# Patient Record
Sex: Female | Born: 1945 | Race: White | Hispanic: No | Smoking: Never smoker
Health system: Southern US, Community
[De-identification: ages and names within clinical notes are randomized; demographics above are authoritative.]

## PROBLEM LIST (undated history)

## (undated) DIAGNOSIS — F329 Major depressive disorder, single episode, unspecified: Secondary | ICD-10-CM

## (undated) DIAGNOSIS — F419 Anxiety disorder, unspecified: Secondary | ICD-10-CM

## (undated) DIAGNOSIS — K219 Gastro-esophageal reflux disease without esophagitis: Secondary | ICD-10-CM

## (undated) DIAGNOSIS — B019 Varicella without complication: Secondary | ICD-10-CM

## (undated) DIAGNOSIS — I10 Essential (primary) hypertension: Secondary | ICD-10-CM

## (undated) DIAGNOSIS — M1990 Unspecified osteoarthritis, unspecified site: Secondary | ICD-10-CM

## (undated) DIAGNOSIS — R51 Headache: Secondary | ICD-10-CM

## (undated) DIAGNOSIS — Z860100 Personal history of colon polyps, unspecified: Secondary | ICD-10-CM

## (undated) DIAGNOSIS — E785 Hyperlipidemia, unspecified: Secondary | ICD-10-CM

## (undated) DIAGNOSIS — K589 Irritable bowel syndrome without diarrhea: Secondary | ICD-10-CM

## (undated) DIAGNOSIS — T7840XA Allergy, unspecified, initial encounter: Secondary | ICD-10-CM

## (undated) DIAGNOSIS — F32A Depression, unspecified: Secondary | ICD-10-CM

## (undated) HISTORY — DX: Irritable bowel syndrome, unspecified: K58.9

## (undated) HISTORY — PX: ABDOMINAL HYSTERECTOMY: SHX81

## (undated) HISTORY — DX: Depression, unspecified: F32.A

## (undated) HISTORY — DX: Unspecified osteoarthritis, unspecified site: M19.90

## (undated) HISTORY — DX: Anxiety disorder, unspecified: F41.9

## (undated) HISTORY — PX: TUBAL LIGATION: SHX77

## (undated) HISTORY — DX: Varicella without complication: B01.9

## (undated) HISTORY — PX: EXCISIONAL HEMORRHOIDECTOMY: SHX1541

## (undated) HISTORY — DX: Hyperlipidemia, unspecified: E78.5

## (undated) HISTORY — PX: CHOLECYSTECTOMY: SHX55

## (undated) HISTORY — DX: Major depressive disorder, single episode, unspecified: F32.9

## (undated) HISTORY — DX: Personal history of colon polyps, unspecified: Z86.0100

## (undated) HISTORY — DX: Gastro-esophageal reflux disease without esophagitis: K21.9

## (undated) HISTORY — PX: OTHER SURGICAL HISTORY: SHX169

## (undated) HISTORY — DX: Allergy, unspecified, initial encounter: T78.40XA

## (undated) HISTORY — DX: Personal history of colonic polyps: Z86.010

---
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
## 1973-04-28 HISTORY — PX: OTHER SURGICAL HISTORY: SHX169

## 1973-04-28 HISTORY — PX: ABDOMINAL HYSTERECTOMY: SHX81

## 1975-04-29 HISTORY — PX: CHOLECYSTECTOMY: SHX55

## 1997-04-28 HISTORY — PX: BREAST BIOPSY: SHX20

## 1997-09-26 ENCOUNTER — Encounter: Admission: RE | Admit: 1997-09-26 | Discharge: 1997-12-25 | Payer: Self-pay | Admitting: *Deleted

## 2001-04-28 HISTORY — PX: EXCISIONAL HEMORRHOIDECTOMY: SHX1541

## 2002-04-28 LAB — HM COLONOSCOPY

## 2010-04-28 LAB — HM MAMMOGRAPHY

## 2011-07-17 ENCOUNTER — Other Ambulatory Visit: Payer: Self-pay

## 2011-07-17 ENCOUNTER — Observation Stay (HOSPITAL_COMMUNITY)
Admission: EM | Admit: 2011-07-17 | Discharge: 2011-07-19 | DRG: 312 | Disposition: A | Discharge status: Home / Self Care | Payer: Medicare Other | Attending: Family Medicine | Admitting: Family Medicine

## 2011-07-17 ENCOUNTER — Emergency Department (HOSPITAL_COMMUNITY): Payer: Medicare Other

## 2011-07-17 ENCOUNTER — Encounter (HOSPITAL_COMMUNITY): Payer: Self-pay | Admitting: Emergency Medicine

## 2011-07-17 DIAGNOSIS — E785 Hyperlipidemia, unspecified: Secondary | ICD-10-CM | POA: Insufficient documentation

## 2011-07-17 DIAGNOSIS — R05 Cough: Secondary | ICD-10-CM | POA: Insufficient documentation

## 2011-07-17 DIAGNOSIS — R5381 Other malaise: Secondary | ICD-10-CM | POA: Insufficient documentation

## 2011-07-17 DIAGNOSIS — I129 Hypertensive chronic kidney disease with stage 1 through stage 4 chronic kidney disease, or unspecified chronic kidney disease: Secondary | ICD-10-CM | POA: Insufficient documentation

## 2011-07-17 DIAGNOSIS — I440 Atrioventricular block, first degree: Secondary | ICD-10-CM | POA: Diagnosis present

## 2011-07-17 DIAGNOSIS — I959 Hypotension, unspecified: Secondary | ICD-10-CM | POA: Diagnosis present

## 2011-07-17 DIAGNOSIS — I10 Essential (primary) hypertension: Secondary | ICD-10-CM | POA: Diagnosis present

## 2011-07-17 DIAGNOSIS — N189 Chronic kidney disease, unspecified: Secondary | ICD-10-CM | POA: Diagnosis present

## 2011-07-17 DIAGNOSIS — I498 Other specified cardiac arrhythmias: Secondary | ICD-10-CM | POA: Insufficient documentation

## 2011-07-17 DIAGNOSIS — R55 Syncope and collapse: Secondary | ICD-10-CM | POA: Diagnosis present

## 2011-07-17 DIAGNOSIS — N179 Acute kidney failure, unspecified: Secondary | ICD-10-CM

## 2011-07-17 DIAGNOSIS — R197 Diarrhea, unspecified: Secondary | ICD-10-CM | POA: Insufficient documentation

## 2011-07-17 DIAGNOSIS — R059 Cough, unspecified: Secondary | ICD-10-CM | POA: Insufficient documentation

## 2011-07-17 DIAGNOSIS — R531 Weakness: Secondary | ICD-10-CM

## 2011-07-17 DIAGNOSIS — E119 Type 2 diabetes mellitus without complications: Secondary | ICD-10-CM | POA: Insufficient documentation

## 2011-07-17 DIAGNOSIS — D72829 Elevated white blood cell count, unspecified: Secondary | ICD-10-CM | POA: Diagnosis present

## 2011-07-17 HISTORY — DX: Acute kidney failure, unspecified: N17.9

## 2011-07-17 HISTORY — DX: Essential (primary) hypertension: I10

## 2011-07-17 HISTORY — DX: Headache: R51

## 2011-07-17 LAB — GLUCOSE, CAPILLARY

## 2011-07-17 LAB — CBC
Hemoglobin: 11.5 g/dL — ABNORMAL LOW (ref 12.0–15.0)
MCH: 30.2 pg (ref 26.0–34.0)
MCHC: 33.7 g/dL (ref 30.0–36.0)
Platelets: 200 10*3/uL (ref 150–400)

## 2011-07-17 LAB — BASIC METABOLIC PANEL
Calcium: 8.7 mg/dL (ref 8.4–10.5)
Creatinine, Ser: 1.46 mg/dL — ABNORMAL HIGH (ref 0.50–1.10)
GFR calc Af Amer: 42 mL/min — ABNORMAL LOW (ref >90)
GFR calc non Af Amer: 37 mL/min — ABNORMAL LOW (ref >90)

## 2011-07-17 LAB — URINALYSIS, ROUTINE W REFLEX MICROSCOPIC
Bilirubin Urine: NEGATIVE
Ketones, ur: NEGATIVE mg/dL
Nitrite: NEGATIVE
Urobilinogen, UA: 1 mg/dL (ref 0.0–1.0)

## 2011-07-17 LAB — CARDIAC PANEL(CRET KIN+CKTOT+MB+TROPI)
CK, MB: 0.8 ng/mL (ref 0.3–4.0)
Relative Index: INVALID (ref 0.0–2.5)
Total CK: 40 U/L (ref 7–177)
Troponin I: <0.3 ng/mL (ref <0.30)

## 2011-07-17 LAB — LACTIC ACID, PLASMA: Lactic Acid, Venous: 0.7 mmol/L (ref 0.5–2.2)

## 2011-07-17 MED ORDER — CITALOPRAM HYDROBROMIDE 40 MG PO TABS
40.0000 mg | ORAL_TABLET | Freq: Every day | ORAL | Status: DC
  Administered 2011-07-18 – 2011-07-19 (×3): 40 mg via ORAL
  Filled 2011-07-17 (×3): qty 1

## 2011-07-17 MED ORDER — INSULIN DETEMIR 100 UNIT/ML ~~LOC~~ SOLN
50.0000 [IU] | Freq: Two times a day (BID) | SUBCUTANEOUS | Status: DC
  Administered 2011-07-18: 50 [IU] via SUBCUTANEOUS
  Filled 2011-07-17: qty 10

## 2011-07-17 MED ORDER — PANTOPRAZOLE SODIUM 20 MG PO TBEC
20.0000 mg | DELAYED_RELEASE_TABLET | Freq: Every day | ORAL | Status: DC
  Administered 2011-07-18 – 2011-07-19 (×3): 20 mg via ORAL
  Filled 2011-07-17 (×3): qty 1

## 2011-07-17 MED ORDER — SODIUM CHLORIDE 0.9 % IJ SOLN: 3.0000 mL | Freq: Two times a day (BID) | INTRAMUSCULAR | Status: DC

## 2011-07-17 MED ORDER — LIRAGLUTIDE 18 MG/3ML ~~LOC~~ SOLN: 1.8000 mg | Freq: Every day | SUBCUTANEOUS | Status: DC

## 2011-07-17 MED ORDER — AMITRIPTYLINE HCL 50 MG PO TABS
50.0000 mg | ORAL_TABLET | Freq: Every day | ORAL | Status: DC
  Administered 2011-07-18: 50 mg via ORAL
  Filled 2011-07-17 (×2): qty 1

## 2011-07-17 MED ORDER — SODIUM CHLORIDE 0.9 % IV BOLUS (SEPSIS)
1000.0000 mL | Freq: Once | INTRAVENOUS | Status: AC
  Administered 2011-07-17: 1000 mL via INTRAVENOUS

## 2011-07-17 MED ORDER — HYDROCODONE-ACETAMINOPHEN 5-325 MG PO TABS: 1.0000 | ORAL_TABLET | ORAL | Status: DC | PRN

## 2011-07-17 MED ORDER — ONDANSETRON HCL 4 MG/2ML IJ SOLN: 4.0000 mg | Freq: Four times a day (QID) | INTRAMUSCULAR | Status: DC | PRN

## 2011-07-17 MED ORDER — SIMVASTATIN 5 MG PO TABS
5.0000 mg | ORAL_TABLET | Freq: Every day | ORAL | Status: DC
  Administered 2011-07-18: 5 mg via ORAL
  Filled 2011-07-17 (×2): qty 1

## 2011-07-17 MED ORDER — SODIUM CHLORIDE 0.9 % IV SOLN
INTRAVENOUS | Status: DC
  Administered 2011-07-17 – 2011-07-18 (×3): via INTRAVENOUS

## 2011-07-17 MED ORDER — ONDANSETRON HCL 4 MG PO TABS: 4.0000 mg | ORAL_TABLET | Freq: Four times a day (QID) | ORAL | Status: DC | PRN

## 2011-07-17 MED ORDER — INSULIN ASPART 100 UNIT/ML ~~LOC~~ SOLN
0.0000 [IU] | Freq: Three times a day (TID) | SUBCUTANEOUS | Status: DC
  Administered 2011-07-18 (×2): 2 [IU] via SUBCUTANEOUS
  Administered 2011-07-19: 3 [IU] via SUBCUTANEOUS
  Filled 2011-07-17: qty 1

## 2011-07-17 MED ORDER — INSULIN ASPART 100 UNIT/ML ~~LOC~~ SOLN
0.0000 [IU] | Freq: Every day | SUBCUTANEOUS | Status: DC
  Administered 2011-07-18: via SUBCUTANEOUS
  Administered 2011-07-18: 2 [IU] via SUBCUTANEOUS
  Filled 2011-07-17: qty 1

## 2011-07-17 MED ORDER — TRAMADOL HCL 50 MG PO TABS: 50.0000 mg | ORAL_TABLET | Freq: Four times a day (QID) | ORAL | Status: DC | PRN

## 2011-07-17 MED ORDER — FERROUS SULFATE 325 (65 FE) MG PO TABS
325.0000 mg | ORAL_TABLET | Freq: Every day | ORAL | Status: DC
  Administered 2011-07-18 – 2011-07-19 (×2): 325 mg via ORAL
  Filled 2011-07-17 (×2): qty 1

## 2011-07-17 NOTE — ED Provider Notes (Signed)
History     CSN: [id number]  Arrival date & time 07/17/11  1333   First MD Initiated Contact with Patient 07/17/11 1510      Chief Complaint  Patient presents with  . Hypotension  . Bradycardia    (Consider location/radiation/quality/duration/timing/severity/associated sxs/prior treatment) HPI Patient presents with generalized weakness. She was out the ED with her mother today and experience profound weakness with a sensation that she did have a bowel movement. She went to the restroom and had an episode of near-syncope. Her mother found her in the bathroom and she was unable to stand due to weakness and was responding but very weak. She has no recent illnesses she has been eating and drinking normally. She denied any chest pain or palpitations. She's had no recent fevers. She does note that she felt somewhat weak upon awakening this morning but had felt well the past several days. Per EMS her blood pressure was 70/30 upon arrival and she was bradycardic at 51. She's had no recent medication changes except starting on Victoza 2 weeks ago, but had no adverse symptoms until today.  Pt denies rectal bleeding or melena  Past Medical History  Diagnosis Date  . Diabetes mellitus   . Hypertension   . Headache     migraines    Past Surgical History  Procedure Date  . Abdominal hysterectomy   . Cholecystectomy   . Tubal ligation     History reviewed. No pertinent family history.  History  Substance Use Topics  . Smoking status: Never Smoker   . Smokeless tobacco: Never Used  . Alcohol Use: No    OB History    Grav Para Term Preterm Abortions TAB SAB Ect Mult Living                  Review of Systems ROS reviewed and otherwise negative except for mentioned in HPI  Allergies  Codeine and Morphine and related  Home Medications   Current Outpatient Rx  Name Route Sig Dispense Refill  . AMITRIPTYLINE HCL 50 MG PO TABS Oral Take 50 mg by mouth at bedtime. sleep    .  CITALOPRAM HYDROBROMIDE 40 MG PO TABS Oral Take 40 mg by mouth daily.    . IRON 325 (65 FE) MG PO TABS Oral Take 1 tablet by mouth daily.    . FUROSEMIDE 20 MG PO TABS Oral Take 20 mg by mouth every other day.    Marland Kitchen HYDROCODONE-ACETAMINOPHEN 5-500 MG PO TABS Oral Take 1 tablet by mouth every 6 (six) hours as needed. pain    . INSULIN DETEMIR 100 UNIT/ML [place] SOLN Subcutaneous Inject 50 Units into the skin 2 (two) times daily.    Marland Kitchen LANSOPRAZOLE 30 MG PO CPDR Oral Take 30 mg by mouth daily.    Marland Kitchen VICTOZA [place] Subcutaneous Inject 1.8 mg into the skin daily. PT USES THE FLEX PEN    . LISINOPRIL 5 MG PO TABS Oral Take 5 mg by mouth daily.    . MULTI-VITAMIN/MINERALS PO TABS Oral Take 1 tablet by mouth daily.    Marland Kitchen FISH OIL 1000 MG PO CPDR Oral Take 1 capsule by mouth 2 (two) times daily.    Marland Kitchen PRAVASTATIN SODIUM 40 MG PO TABS Oral Take 40 mg by mouth 2 (two) times daily.    Marland Kitchen PROPRANOLOL HCL 80 MG PO TABS Oral Take 80 mg by mouth 2 (two) times daily.    . TRAMADOL HCL 50 MG PO TABS Oral Take 50 mg  by mouth every 6 (six) hours as needed. pain      BP 99/74  Pulse 80  Temp(Src) 98.3 F (36.8 C) (Oral)  Resp 18  Ht 5\' 2"  (1.575 m)  Wt 180 lb (81.647 kg)  BMI 32.92 kg/m2  SpO2 100% Vitals reviewed Physical Exam Physical Examination: General appearance - alert, tired appearing, and in no acute distress Mental status - alert, oriented to person, place, and time Eyes - pupils equal and reactive, extraocular eye movements intact, no scleral icterus Mouth - mucous membranes moist, pharynx normal without lesions Chest - clear to auscultation, no wheezes, rales or rhonchi, symmetric air entry Heart - normal rate, regular rhythm, normal S1, S2, no murmurs, rubs, clicks or gallops Abdomen - soft, nontender, nondistended, no masses or organomegaly, nabs Neurological - alert, oriented, normal speech, strength 5/5 in extremities x 4, sensation intact, cranial nerves 2-12 tested and intact Musculoskeletal - no  joint tenderness, deformity or swelling Extremities - peripheral pulses normal, no pedal edema, no clubbing or cyanosis Skin - normal coloration and turgor, no rashes, brisk cap refill  ED Course  Procedures (including critical care time)  Date: 07/17/2011  Rate: 62  Rhythm: sinus rhythm with first degree AV block  QRS Axis: normal  Intervals: PR prolonged  ST/T Wave abnormalities: nonspecific ST/T changes  Conduction Disutrbances:first-degree A-V block   Narrative Interpretation:   Old EKG Reviewed: none available  8:10 PM d/w [facility] for admission, pt to go to telemetry bed, Team 5   Labs Reviewed  BASIC METABOLIC PANEL - Abnormal; Notable for the following:    Glucose, Bld 129 (*)    Creatinine, Ser 1.46 (*)    GFR calc non Af Amer 37 (*)    GFR calc Af Amer 42 (*)    All other components within normal limits  CBC - Abnormal; Notable for the following:    WBC 17.0 (*)    RBC 3.81 (*)    Hemoglobin 11.5 (*)    HCT 34.1 (*)    All other components within normal limits  GLUCOSE, CAPILLARY - Abnormal; Notable for the following:    Glucose-Capillary 269 (*)    All other components within normal limits  LACTIC ACID, PLASMA  URINALYSIS, ROUTINE W REFLEX MICROSCOPIC  MAGNESIUM  PHOSPHORUS  CARDIAC PANEL(CRET KIN+CKTOT+MB+TROPI)  CBC  TSH  CARDIAC PANEL(CRET KIN+CKTOT+MB+TROPI)  HEMOGLOBIN A1C  BASIC METABOLIC PANEL  CBC  HEMOGLOBIN A1C  TSH  CARDIAC PANEL(CRET KIN+CKTOT+MB+TROPI)   Dg Chest 2 View  07/17/2011  *RADIOLOGY REPORT*  Clinical Data: Cough.  Hypotension and bradycardia.  CHEST - 2 VIEW  Comparison: None.  Findings: Trachea is midline.  Heart size normal.  Lungs are low in volume but clear.  No pleural fluid.  IMPRESSION: No acute findings.  Original Report Authenticated By: Reyes Ivan, M.D.     1. Syncope   2. Leukocytosis   3. Weakness       MDM  Patient presenting with generalized weakness and a syncopal event.  Pt with  hypotensive and bradycardia- blood pressure improving in ED after IV hydration,  However patient continuing to c/o generalized weakness.  Pt admitted to [facility] for further management        Ethelda Chick, MD 07/18/11 [phone]

## 2011-07-17 NOTE — ED Notes (Addendum)
EMS bring pt from [employer] pt was found in bathroom after being gone a while by mother sitting on toilet fully dressed lumped over pt did not loose consciousness but felt too weak to move. EMS reports pt was bradycardic 51 and hypotensive 70/43.  Pt states she began a new med Victoza 2 weeks ago and has been feeling weak since beginning.

## 2011-07-17 NOTE — H&P (Signed)
PCP:  No primary provider on file.   DOA:  07/17/2011  1:52 PM  Chief Complaint:  Weakness  HPI: Pt is 66 yo female with past medical history noted below who presents to [facility] ED with main concern of feeling generalized weakness that occurred this morning of admission while pt was in the bathroom. Pt denies similar episodes in the past and reports no known heart or neurologic problems. In addition, she reports being in her usual state of health, had no chest pain or shortness of breath, no abdominal or urinary concerns, no focal neurologic deficits, no headaches and no visual changes. She also denies having done any strenuous physical activity prior to the event. She has not fallen and has not lost consciousness. She denies fevers, chills, other systmeic symptoms. Patient does report having diarrhea started on the day of admission and has had 2 episodes prior to arrival to ED.  Pt was noted to be hypotensive upon EMS arrival with SBP in 70's.  Assessment/Plan  Principal Problem:   *Presyncope - unclear what the provoking event is, perhaps secondary to viral ilness - orthostatic vitals are negative - will admit to telemetry floor for further observation - obtain cardiac work up: cardiac enzymes, TSH, CMET, B12 - IV fluids - PT evaluation  Active Problems:  Diarrhea - possibly secondary to viral gastroenteritis - continue IV fluids for now - patient had 2 episodes of diarrhea since the admission to ED   ARF (acute renal failure) - likely pre-renal in etiology secondary to dehydration - start IVF - BMP in AM   Leukocytosis - unclear etiology but perhaps secondary to viral gastroenteritis/norovirus - urinalysis is negative and CXR with no active cardiopulmonary disease - repeat CBC in am - continue IV fluids   Hypotension - hold home BP medications including lisinopril and lasix - provide IVF - monitor vitals on telemetry unit   Diabetes - check A1C and continue home medication  regimen - sliding scale insulin - continue CBG monitoring   Hyperlipidemia - continue statin  DVT Prophylaxis - SCD's  Code Status - FULL  Education  - test results and diagnostic studies were discussed with patient - patient verbalized the understanding - questions were answered at the bedside and contact information was provided for additional questions or concerns     Allergies: Allergies  Allergen Reactions  . Codeine     Chest pain  . Morphine And Related     Chest pain     Prior to Admission medications   Medication Sig Start Date End Date Taking? Authorizing Provider  amitriptyline (ELAVIL) 50 MG tablet Take 50 mg by mouth at bedtime. sleep   Yes Historical Provider, MD  citalopram (CELEXA) 40 MG tablet Take 40 mg by mouth daily.   Yes Historical Provider, MD  Ferrous Sulfate (IRON) 325 (65 FE) MG TABS Take 1 tablet by mouth daily.   Yes Historical Provider, MD  furosemide (LASIX) 20 MG tablet Take 20 mg by mouth every other day.   Yes Historical Provider, MD  HYDROcodone-acetaminophen (VICODIN) 5-500 MG per tablet Take 1 tablet by mouth every 6 (six) hours as needed. pain   Yes Historical Provider, MD  insulin detemir (LEVEMIR) 100 UNIT/ML injection Inject 50 Units into the skin 2 (two) times daily.   Yes Historical Provider, MD  lansoprazole (PREVACID) 30 MG capsule Take 30 mg by mouth daily.   Yes Historical Provider, MD  Liraglutide (VICTOZA [place]) Inject 1.8 mg into the skin daily. PT USES  THE FLEX PEN   Yes Historical Provider, MD  lisinopril (PRINIVIL,ZESTRIL) 5 MG tablet Take 5 mg by mouth daily.   Yes Historical Provider, MD  Multiple Vitamins-Minerals (MULTIVITAMIN WITH MINERALS) tablet Take 1 tablet by mouth daily.   Yes Historical Provider, MD  Omega-3 Fatty Acids (FISH OIL) 1000 MG CPDR Take 1 capsule by mouth 2 (two) times daily.   Yes Historical Provider, MD  pravastatin (PRAVACHOL) 40 MG tablet Take 40 mg by mouth 2 (two) times daily.   Yes Historical  Provider, MD  propranolol (INDERAL) 80 MG tablet Take 80 mg by mouth 2 (two) times daily.   Yes Historical Provider, MD  traMADol (ULTRAM) 50 MG tablet Take 50 mg by mouth every 6 (six) hours as needed. pain   Yes Historical Provider, MD    Past Medical History  Diagnosis Date  . Diabetes mellitus   . Hypertension   . Headache     migraines    Past Surgical History  Procedure Date  . Abdominal hysterectomy   . Cholecystectomy   . Tubal ligation     Social History:  reports that she has never smoked. She has never used smokeless tobacco. She reports that she does not drink alcohol or use illicit drugs.  History reviewed. No pertinent family history.  Review of Systems:  Constitutional: Denies fever, chills, diaphoresis, appetite change and fatigue.  HEENT: Denies photophobia, eye pain, redness, hearing loss, ear pain, congestion, sore throat, rhinorrhea, sneezing, mouth sores, trouble swallowing, neck pain, neck stiffness and tinnitus.   Respiratory: (-) SOB, DOE, (+) chronic cough, (-)chest tightness,  And no wheezing.   Cardiovascular: Denies chest pain, palpitations and leg swelling.  Gastrointestinal: Denies nausea, vomiting, abdominal pain, diarrhea, constipation, blood in stool and abdominal distention.  Genitourinary: Denies dysuria, urgency, frequency, hematuria, flank pain and difficulty urinating.  Musculoskeletal: Denies myalgias, back pain, joint swelling, arthralgias and gait problem.  Skin: Denies pallor, rash and wound.  Neurological: Denies dizziness, seizures, weakness, light-headedness, numbness and headaches.  Hematological: Denies adenopathy. Easy bruising, personal or family bleeding history  Psychiatric/Behavioral: Denies suicidal ideation, mood changes, confusion, nervousness, sleep disturbance and agitation   Physical Exam:  Filed Vitals:   07/17/11 1413 07/17/11 1446 07/17/11 1500 07/17/11 1530  BP: 94/48 110/43 108/50 122/85  Pulse: 66  66 63    Temp:      TempSrc:      Resp: 21 16 15 18   Height:      Weight:      SpO2: 100%  100% 99%    Constitutional: Vital signs reviewed.  Patient is in no acute distress and cooperative with exam. Alert and oriented x3.  Head: Normocephalic and atraumatic Ear: TM normal bilaterally Mouth: no erythema or exudates, MMM Eyes: PERRL, EOMI, conjunctivae normal, No scleral icterus.  Neck: Supple, Trachea midline normal ROM, No JVD, mass, thyromegaly, or carotid bruit present.  Cardiovascular: RRR, S1 normal, S2 normal, no MRG, pulses symmetric and intact bilaterally Pulmonary/Chest: CTAB, no wheezes, rales, or rhonchi Abdominal: Soft. Non-tender, non-distended, bowel sounds are normal, no masses, organomegaly, or guarding present.  GU: no CVA tenderness Musculoskeletal: No joint deformities, erythema, or stiffness, ROM full and no nontender Ext: no edema and no cyanosis, pulses palpable bilaterally (DP and PT) Hematology: no cervical, inginal, or axillary adenopathy.  Neurological: A&O x3, Strenght is normal and symmetric bilaterally, cranial nerve II-XII are grossly intact, no focal motor deficit, sensory intact to light touch bilaterally.  Skin: Warm, dry and intact. No rash,  cyanosis, or clubbing.  Psychiatric: Normal mood and affect. speech and behavior is normal. Judgment and thought content normal. Cognition and memory are normal.   Labs on Admission:  Results for orders placed during the hospital encounter of 07/17/11 (from the past 48 hour(s))  BASIC METABOLIC PANEL     Status: Abnormal   Collection Time   07/17/11  3:30 PM      Component Value Range Comment   Sodium 136  135 - 145 (mEq/L)    Potassium 4.8  3.5 - 5.1 (mEq/L) SLIGHT HEMOLYSIS   Chloride 108  96 - 112 (mEq/L)    CO2 21  19 - 32 (mEq/L)    Glucose, Bld 129 (*) 70 - 99 (mg/dL)    BUN 22  6 - 23 (mg/dL)    Creatinine, Ser 8.29 (*) 0.50 - 1.10 (mg/dL)    Calcium 8.7  8.4 - 10.5 (mg/dL)    GFR calc non Af Amer 37 (*)  >90 (mL/min)    GFR calc Af Amer 42 (*) >90 (mL/min)   LACTIC ACID, PLASMA     Status: Normal   Collection Time   07/17/11  3:30 PM      Component Value Range Comment   Lactic Acid, Venous 0.7  0.5 - 2.2 (mmol/L)   URINALYSIS, ROUTINE W REFLEX MICROSCOPIC     Status: Normal   Collection Time   07/17/11  4:04 PM      Component Value Range Comment   Color, Urine YELLOW  YELLOW     APPearance CLEAR  CLEAR     Specific Gravity, Urine 1.015  1.005 - 1.030     pH 6.0  5.0 - 8.0     Glucose, UA NEGATIVE  NEGATIVE (mg/dL)    Hgb urine dipstick NEGATIVE  NEGATIVE     Bilirubin Urine NEGATIVE  NEGATIVE     Ketones, ur NEGATIVE  NEGATIVE (mg/dL)    Protein, ur NEGATIVE  NEGATIVE (mg/dL)    Urobilinogen, UA 1.0  0.0 - 1.0 (mg/dL)    Nitrite NEGATIVE  NEGATIVE     Leukocytes, UA NEGATIVE  NEGATIVE  MICROSCOPIC NOT DONE ON URINES WITH NEGATIVE PROTEIN, BLOOD, LEUKOCYTES, NITRITE, OR GLUCOSE <1000 mg/dL.  CBC     Status: Abnormal   Collection Time   07/17/11  7:14 PM      Component Value Range Comment   WBC 17.0 (*) 4.0 - 10.5 (K/uL)    RBC 3.81 (*) 3.87 - 5.11 (MIL/uL)    Hemoglobin 11.5 (*) 12.0 - 15.0 (g/dL)    HCT 56.2 (*) 13.0 - 46.0 (%)    MCV 89.5  78.0 - 100.0 (fL)    MCH 30.2  26.0 - 34.0 (pg)    MCHC 33.7  30.0 - 36.0 (g/dL)    RDW 86.5  78.4 - 69.6 (%)    Platelets 200  150 - 400 (K/uL)     Time Spent on Admission: Over 30 minutes  Deadrick Stidd 07/17/2011, 8:20 PM  [facility] Pager # [phone] Main Office # [phone]

## 2011-07-17 NOTE — ED Notes (Signed)
[id number]<JY> Expected date:<BR> Expected time: 1:24 PM<BR> Means of arrival:<BR> Comments:<BR> M70 - 66yoF weakness x2wks

## 2011-07-18 ENCOUNTER — Other Ambulatory Visit: Payer: Self-pay

## 2011-07-18 LAB — BASIC METABOLIC PANEL
BUN: 23 mg/dL (ref 6–23)
Chloride: 109 mEq/L (ref 96–112)
Glucose, Bld: 105 mg/dL — ABNORMAL HIGH (ref 70–99)
Potassium: 3.9 mEq/L (ref 3.5–5.1)
Sodium: 138 mEq/L (ref 135–145)

## 2011-07-18 LAB — GLUCOSE, CAPILLARY
Glucose-Capillary: 67 mg/dL — ABNORMAL LOW (ref 70–99)
Glucose-Capillary: 126 mg/dL — ABNORMAL HIGH (ref 70–99)
Glucose-Capillary: 227 mg/dL — ABNORMAL HIGH (ref 70–99)

## 2011-07-18 LAB — CBC
HCT: 31.1 % — ABNORMAL LOW (ref 36.0–46.0)
Hemoglobin: 10.5 g/dL — ABNORMAL LOW (ref 12.0–15.0)
MCH: 30.1 pg (ref 26.0–34.0)
MCHC: 33.8 g/dL (ref 30.0–36.0)
RBC: 3.49 MIL/uL — ABNORMAL LOW (ref 3.87–5.11)

## 2011-07-18 LAB — CARDIAC PANEL(CRET KIN+CKTOT+MB+TROPI)
CK, MB: 1 ng/mL (ref 0.3–4.0)
Relative Index: INVALID (ref 0.0–2.5)
Total CK: 34 U/L (ref 7–177)
Troponin I: <0.3 ng/mL (ref <0.30)

## 2011-07-18 MED ORDER — INSULIN DETEMIR 100 UNIT/ML ~~LOC~~ SOLN
30.0000 [IU] | Freq: Two times a day (BID) | SUBCUTANEOUS | Status: DC
  Administered 2011-07-18 – 2011-07-19 (×2): 30 [IU] via SUBCUTANEOUS
  Filled 2011-07-18: qty 3

## 2011-07-18 MED ORDER — INSULIN DETEMIR 100 UNIT/ML ~~LOC~~ SOLN: 40.0000 [IU] | Freq: Two times a day (BID) | SUBCUTANEOUS | Status: DC

## 2011-07-18 NOTE — Evaluation (Signed)
Physical Therapy Evaluation One time eval and D/C from acute PT Patient Details Name: Mary Norris MRN: [id number] DOB: 1946/03/26 Today's Date: 07/18/2011  Problem List:  Patient Active Problem List  Diagnoses  . ARF (acute renal failure)  . Leukocytosis  . Hypotension  . Syncope  . Diabetes mellitus    Past Medical History:  Past Medical History  Diagnosis Date  . Diabetes mellitus   . Hypertension   . Headache     migraines   Past Surgical History:  Past Surgical History  Procedure Date  . Abdominal hysterectomy   . Cholecystectomy   . Tubal ligation     PT Assessment/Plan/Recommendation PT Assessment Clinical Impression Statement: Pt admitted for syncope episode.  Pt currently at modified independent level.  Pt educated to rest upon position changes and sit if feeling dizziness.  No dizziness upon evaluation.  Pt will have support from son and mother upon return home if needed.  No further f/u necessary. PT Recommendation/Assessment: Patent does not need any further PT services No Skilled PT: Patient is modified independent with all activity/mobility;Patient will have necessary level of assist by caregiver at discharge PT Recommendation Follow Up Recommendations: No PT follow up Equipment Recommended: None recommended by PT PT Goals     PT Evaluation Precautions/Restrictions  Precautions Required Braces or Orthoses: No Restrictions Weight Bearing Restrictions: No Other Position/Activity Restrictions: Pt w syncopal episode.  Recommended pt sit for a few minutes before standing w hen first getting up. Prior Functioning  Home Living Lives With: Alone Receives Help From: Family (MOther lives close but pt very I.) Type of Home: House Home Layout: One level Home Access: Stairs to enter;Ramped entrance Entrance Stairs-Rails: None Entrance Stairs-Number of Steps: 5 Bathroom Shower/Tub: Chief Operating Officer  Equipment: None Prior Function Level of Independence: Independent with basic ADLs;Independent with gait Able to Take Stairs?: Yes Driving: Yes Vocation: Retired Leisure: Hobbies-yes (Comment) Comments: Plays with dog. Cognition Cognition Arousal/Alertness: Awake/alert Overall Cognitive Status: Appears within functional limits for tasks assessed Cognition - Other Comments: Intact. Sensation/Coordination Sensation Light Touch: Appears Intact Coordination Gross Motor Movements are Fluid and Coordinated: Yes Fine Motor Movements are Fluid and Coordinated: Yes Extremity Assessment RUE Assessment RUE Assessment: Within Functional Limits LUE Assessment LUE Assessment: Within Functional Limits RLE Assessment RLE Assessment: Within Functional Limits LLE Assessment LLE Assessment: Within Functional Limits Mobility (including Balance) Bed Mobility Bed Mobility: Yes Supine to Sit: 7: Independent Transfers Transfers: Yes Sit to Stand: 7: Independent Stand to Sit: 7: Independent Ambulation/Gait Ambulation/Gait: Yes Ambulation/Gait Assistance: 6: Modified independent (Device/Increase time) Ambulation Distance (Feet): 400 Feet Assistive device: None Gait Pattern: Within Functional Limits Gait velocity: slow cadence    Exercise    End of Session PT - End of Session Activity Tolerance: Patient tolerated treatment well Patient left: in bed;with call bell in reach;with family/visitor present General Behavior During Session: [facility] for tasks performed Cognition: [facility] for tasks performed  Jalisa Sacco,KATHrine E 07/18/2011, 11:07 AM Pager: [phone]

## 2011-07-18 NOTE — Evaluation (Signed)
Occupational Therapy Evaluation Patient Details Name: Mary Norris MRN: [id number] DOB: 24-May-1945 Today's Date: 07/18/2011  Problem List:  Patient Active Problem List  Diagnoses  . ARF (acute renal failure)  . Leukocytosis  . Hypotension  . Syncope  . Diabetes mellitus    Past Medical History:  Past Medical History  Diagnosis Date  . Diabetes mellitus   . Hypertension   . Headache     migraines   Past Surgical History:  Past Surgical History  Procedure Date  . Abdominal hysterectomy   . Cholecystectomy   . Tubal ligation     OT Assessment/Plan/Recommendation OT Assessment Clinical Impression Statement: Pt admitted for near syncopal episode last night but now is functioning Ily with all adls.  Pts son will be around when she is d/c'd to check in on her as will pt's mother.  Pt with no further OT needs. OT Recommendation/Assessment: Patient does not need any further OT services OT Recommendation Follow Up Recommendations: No OT follow up Equipment Recommended: None recommended by OT OT Goals    OT Evaluation Precautions/Restrictions  Precautions Required Braces or Orthoses: No Restrictions Weight Bearing Restrictions: No Other Position/Activity Restrictions: Pt w syncopal episode.  Recommended pt sit for a few minutes before standing w hen first getting up. Prior Functioning Home Living Lives With: Alone Receives Help From: Family (MOther lives close but pt very I.) Type of Home: House Home Layout: One level Home Access: Stairs to enter;Ramped entrance Entrance Stairs-Rails: None Entrance Stairs-Number of Steps: 5 Bathroom Shower/Tub: Chief Operating Officer Equipment: None Prior Function Level of Independence: Independent with basic ADLs;Independent with gait Able to Take Stairs?: Yes Driving: Yes Vocation: Retired Leisure: Hobbies-yes (Comment) Comments: Plays with dog. ADL ADL Eating/Feeding:  Performed;Independent Where Assessed - Eating/Feeding: Edge of bed Grooming: Simulated;Independent Where Assessed - Grooming: Standing at sink Upper Body Bathing: Simulated;Independent Where Assessed - Upper Body Bathing: Sitting, chair Lower Body Bathing: Simulated;Independent Where Assessed - Lower Body Bathing: Sit to stand from chair Upper Body Dressing: Simulated;Independent Where Assessed - Upper Body Dressing: Sitting, chair Lower Body Dressing: Performed;Independent Where Assessed - Lower Body Dressing: Sit to stand from chair Toilet Transfer: Performed;Independent Toilet Transfer Method: Proofreader: Regular height toilet Toileting - Clothing Manipulation: Performed;Independent Where Assessed - Toileting Clothing Manipulation: Standing Toileting - Hygiene: Performed;Independent Where Assessed - Toileting Hygiene: Sit on 3-in-1 or toilet Tub/Shower Transfer: Not assessed Tub/Shower Transfer Method: Not assessed Ambulation Related to ADLs: Pt walked in room and in hallway without assist. ADL Comments: Pt overall very I with adls.  Pt w syncopal episode before arrival but seems to be functioning well now. Vision/Perception  Vision - History Baseline Vision: No visual deficits Patient Visual Report: No change from baseline Vision - Assessment Eye Alignment: Within Functional Limits Vision Assessment: Vision not tested Cognition Cognition Arousal/Alertness: Awake/alert Overall Cognitive Status: Appears within functional limits for tasks assessed Cognition - Other Comments: Intact. Sensation/Coordination Sensation Light Touch: Appears Intact Coordination Gross Motor Movements are Fluid and Coordinated: Yes Fine Motor Movements are Fluid and Coordinated: Yes Extremity Assessment RUE Assessment RUE Assessment: Within Functional Limits LUE Assessment LUE Assessment: Within Functional Limits Mobility  Bed Mobility Bed Mobility: Yes Supine to  Sit: 7: Independent Transfers Transfers: Yes Sit to Stand: 7: Independent Stand to Sit: 7: Independent Exercises   End of Session OT - End of Session Activity Tolerance: Patient tolerated treatment well Patient left: in bed;with call bell in reach;with family/visitor present Nurse Communication: Mobility  status for ambulation General Behavior During Session: [facility] for tasks performed Cognition: [facility] for tasks performed   Hope Budds [phone] 07/18/2011, 10:34 AM

## 2011-07-18 NOTE — Progress Notes (Signed)
CBG: 61 Treatment: 15 GM carbohydrate snack  Symptoms: None  Follow-up CBG: Time:0815 CBG Result:67  Possible Reasons for Event: Unknown  Comments/MD notified:will treat and recheck    Mary Norris, Yancey Flemings

## 2011-07-18 NOTE — Progress Notes (Signed)
[facility] Inpatient Progress Note  07/18/2011  Subjective: Pt reports no further episodes, she says that she has been told that she has had renal insufficiency in the past and has been taken off metformin.  Pt also says that she is taking levemir 50 units twice daily.  She denies having low BS at home but has had some low BS here in hospital.  Also she has recently started taking Victoza.    Objective:  Vital signs in last 24 hours: Filed Vitals:   07/18/11 0651 07/18/11 0654 07/18/11 0850 07/18/11 0855  BP: 165/75 158/66 141/69 141/69  Pulse: 88 89    Temp:      TempSrc:      Resp:      Height:      Weight:      SpO2:       Weight change:   Intake/Output Summary (Last 24 hours) at 07/18/11 1220 Last data filed at 07/18/11 1048  Gross per 24 hour  Intake    600 ml  Output   1050 ml  Net   -450 ml   Lab Results  Component Value Date   HGBA1C 7.5* 07/17/2011   Lab Results  Component Value Date   CREATININE 1.43* 07/18/2011    Review of Systems As above, otherwise all reviewed and reported negative  Physical Exam General - awake, alert, no distress, calm, cooperative HEENT - NCAT, MM dry Neck - supple, no JVD Lungs - BBS clear CV - normal s1, s2 sounds Abd - soft, normal BS, nondistended, no masses Ext - no cyanosis, no edema   Lab Results: Results for orders placed during the hospital encounter of 07/17/11 (from the past 24 hour(s))  BASIC METABOLIC PANEL     Status: Abnormal   Collection Time   07/17/11  3:30 PM      Component Value Range   Sodium 136  135 - 145 (mEq/L)   Potassium 4.8  3.5 - 5.1 (mEq/L)   Chloride 108  96 - 112 (mEq/L)   CO2 21  19 - 32 (mEq/L)   Glucose, Bld 129 (*) 70 - 99 (mg/dL)   BUN 22  6 - 23 (mg/dL)   Creatinine, Ser 6.64 (*) 0.50 - 1.10 (mg/dL)   Calcium 8.7  8.4 - 40.3 (mg/dL)   GFR calc non Af Amer 37 (*) >90 (mL/min)   GFR calc Af Amer 42 (*) >90 (mL/min)  LACTIC ACID, PLASMA     Status: Normal   Collection Time   07/17/11  3:30 PM      Component Value Range   Lactic Acid, Venous 0.7  0.5 - 2.2 (mmol/L)  URINALYSIS, ROUTINE W REFLEX MICROSCOPIC     Status: Normal   Collection Time   07/17/11  4:04 PM      Component Value Range   Color, Urine YELLOW  YELLOW    APPearance CLEAR  CLEAR    Specific Gravity, Urine 1.015  1.005 - 1.030    pH 6.0  5.0 - 8.0    Glucose, UA NEGATIVE  NEGATIVE (mg/dL)   Hgb urine dipstick NEGATIVE  NEGATIVE    Bilirubin Urine NEGATIVE  NEGATIVE    Ketones, ur NEGATIVE  NEGATIVE (mg/dL)   Protein, ur NEGATIVE  NEGATIVE (mg/dL)   Urobilinogen, UA 1.0  0.0 - 1.0 (mg/dL)   Nitrite NEGATIVE  NEGATIVE    Leukocytes, UA NEGATIVE  NEGATIVE   CBC     Status: Abnormal   Collection Time  07/17/11  7:14 PM      Component Value Range   WBC 17.0 (*) 4.0 - 10.5 (K/uL)   RBC 3.81 (*) 3.87 - 5.11 (MIL/uL)   Hemoglobin 11.5 (*) 12.0 - 15.0 (g/dL)   HCT 09.8 (*) 11.9 - 46.0 (%)   MCV 89.5  78.0 - 100.0 (fL)   MCH 30.2  26.0 - 34.0 (pg)   MCHC 33.7  30.0 - 36.0 (g/dL)   RDW 14.7  82.9 - 56.2 (%)   Platelets 200  150 - 400 (K/uL)  CARDIAC PANEL(CRET KIN+CKTOT+MB+TROPI)     Status: Normal   Collection Time   07/17/11  8:47 PM      Component Value Range   Total CK 40  7 - 177 (U/L)   CK, MB 0.8  0.3 - 4.0 (ng/mL)   Troponin I <0.30  <0.30 (ng/mL)   Relative Index RELATIVE INDEX IS INVALID  0.0 - 2.5   MAGNESIUM     Status: Normal   Collection Time   07/17/11  8:48 PM      Component Value Range   Magnesium 2.0  1.5 - 2.5 (mg/dL)  PHOSPHORUS     Status: Normal   Collection Time   07/17/11  8:48 PM      Component Value Range   Phosphorus 4.3  2.3 - 4.6 (mg/dL)  TSH     Status: Normal   Collection Time   07/17/11  8:48 PM      Component Value Range   TSH 0.558  0.350 - 4.500 (uIU/mL)  HEMOGLOBIN A1C     Status: Abnormal   Collection Time   07/17/11  8:48 PM      Component Value Range   Hemoglobin A1C 7.5 (*) <5.7 (%)   Mean Plasma Glucose 169 (*) <117 (mg/dL)  GLUCOSE,  CAPILLARY     Status: Abnormal   Collection Time   07/17/11  9:55 PM      Component Value Range   Glucose-Capillary 269 (*) 70 - 99 (mg/dL)   Comment 1 Notify RN    CARDIAC PANEL(CRET KIN+CKTOT+MB+TROPI)     Status: Normal   Collection Time   07/18/11  4:59 AM      Component Value Range   Total CK 34  7 - 177 (U/L)   CK, MB 0.9  0.3 - 4.0 (ng/mL)   Troponin I <0.30  <0.30 (ng/mL)   Relative Index RELATIVE INDEX IS INVALID  0.0 - 2.5   BASIC METABOLIC PANEL     Status: Abnormal   Collection Time   07/18/11  4:59 AM      Component Value Range   Sodium 138  135 - 145 (mEq/L)   Potassium 3.9  3.5 - 5.1 (mEq/L)   Chloride 109  96 - 112 (mEq/L)   CO2 23  19 - 32 (mEq/L)   Glucose, Bld 105 (*) 70 - 99 (mg/dL)   BUN 23  6 - 23 (mg/dL)   Creatinine, Ser 1.30 (*) 0.50 - 1.10 (mg/dL)   Calcium 8.9  8.4 - 86.5 (mg/dL)   GFR calc non Af Amer 38 (*) >90 (mL/min)   GFR calc Af Amer 43 (*) >90 (mL/min)  CBC     Status: Abnormal   Collection Time   07/18/11  4:59 AM      Component Value Range   WBC 11.9 (*) 4.0 - 10.5 (K/uL)   RBC 3.49 (*) 3.87 - 5.11 (MIL/uL)   Hemoglobin 10.5 (*) 12.0 - 15.0 (  g/dL)   HCT 45.4 (*) 09.8 - 46.0 (%)   MCV 89.1  78.0 - 100.0 (fL)   MCH 30.1  26.0 - 34.0 (pg)   MCHC 33.8  30.0 - 36.0 (g/dL)   RDW 11.9  14.7 - 82.9 (%)   Platelets 196  150 - 400 (K/uL)  GLUCOSE, CAPILLARY     Status: Abnormal   Collection Time   07/18/11  7:39 AM      Component Value Range   Glucose-Capillary 61 (*) 70 - 99 (mg/dL)  GLUCOSE, CAPILLARY     Status: Abnormal   Collection Time   07/18/11  8:18 AM      Component Value Range   Glucose-Capillary 67 (*) 70 - 99 (mg/dL)  GLUCOSE, CAPILLARY     Status: Abnormal   Collection Time   07/18/11 11:38 AM      Component Value Range   Glucose-Capillary 126 (*) 70 - 99 (mg/dL)   Comment 1 Notify RN      Micro Results: No results found for this or any previous visit (from the past 240 hour(s)).  Medications:  Scheduled Meds:   .  amitriptyline  50 mg Oral QHS  . citalopram  40 mg Oral Daily  . ferrous sulfate  325 mg Oral Daily  . insulin aspart  0-15 Units Subcutaneous TID WC  . insulin aspart  0-5 Units Subcutaneous QHS  . insulin detemir  50 Units Subcutaneous BID  . pantoprazole  20 mg Oral Q1200  . simvastatin  5 mg Oral q1800  . sodium chloride  1,000 mL Intravenous Once  . sodium chloride  3 mL Intravenous Q12H  . DISCONTD: Liraglutide  1.8 mg Subcutaneous Q1200   Continuous Infusions:   . sodium chloride 75 mL/hr at 07/18/11 0807   PRN Meds:.HYDROcodone-acetaminophen, ondansetron (ZOFRAN) IV, ondansetron, traMADol  Assessment/Plan:  Presyncopal episode - suspect viral illness and dehydration - continue  IVF and cardiac monitoring - following cardiac enzymes and labs - PT eval and OT eval completed  DM 2 - some low BS noted, holding Victoza and decreasing basal insulin coverage  Diarrhea - no recent activity, suspect acute viral illness  ARF/CRF - continue IVFs for now  - follow BMP  Leukocytosis - improved   Hyperlipidemia - continue statin therapy   LOS: 1 day   Sumi Lye 07/18/2011, 12:20 PM   Cleora Fleet, MD, CDE, FAAFP [facility] [place], [place]  [phone]

## 2011-07-18 NOTE — Progress Notes (Signed)
CBG: 67  Treatment: 15 GM carbohydrate snack  Symptoms: None  Follow-up CBG: Time:1140 CBG Result:126  Possible Reasons for Event: Unknown  Comments/MD notified:    Maeola Harman

## 2011-07-19 DIAGNOSIS — I440 Atrioventricular block, first degree: Secondary | ICD-10-CM | POA: Diagnosis present

## 2011-07-19 DIAGNOSIS — N189 Chronic kidney disease, unspecified: Secondary | ICD-10-CM | POA: Diagnosis present

## 2011-07-19 DIAGNOSIS — I10 Essential (primary) hypertension: Secondary | ICD-10-CM

## 2011-07-19 DIAGNOSIS — I129 Hypertensive chronic kidney disease with stage 1 through stage 4 chronic kidney disease, or unspecified chronic kidney disease: Secondary | ICD-10-CM

## 2011-07-19 HISTORY — DX: Chronic kidney disease, unspecified: N18.9

## 2011-07-19 HISTORY — DX: Atrioventricular block, first degree: I44.0

## 2011-07-19 HISTORY — DX: Essential (primary) hypertension: I10

## 2011-07-19 HISTORY — DX: Hypertensive chronic kidney disease with stage 1 through stage 4 chronic kidney disease, or unspecified chronic kidney disease: I12.9

## 2011-07-19 LAB — GLUCOSE, CAPILLARY
Glucose-Capillary: 111 mg/dL — ABNORMAL HIGH (ref 70–99)
Glucose-Capillary: 176 mg/dL — ABNORMAL HIGH (ref 70–99)

## 2011-07-19 MED ORDER — LISINOPRIL 5 MG PO TABS
5.0000 mg | ORAL_TABLET | Freq: Every day | ORAL | Status: DC
  Administered 2011-07-19: 5 mg via ORAL
  Filled 2011-07-19: qty 1

## 2011-07-19 MED ORDER — PROPRANOLOL HCL 40 MG PO TABS
40.0000 mg | ORAL_TABLET | Freq: Two times a day (BID) | ORAL | Status: DC
  Administered 2011-07-19: 40 mg via ORAL
  Filled 2011-07-19 (×2): qty 1

## 2011-07-19 MED ORDER — CITALOPRAM HYDROBROMIDE 20 MG PO TABS: 20.0000 mg | ORAL_TABLET | Freq: Every day | ORAL | Status: AC

## 2011-07-19 MED ORDER — CITALOPRAM HYDROBROMIDE 20 MG PO TABS: 20.0000 mg | ORAL_TABLET | Freq: Every day | ORAL | Status: DC

## 2011-07-19 MED ORDER — INSULIN DETEMIR 100 UNIT/ML ~~LOC~~ SOLN: 30.0000 [IU] | Freq: Two times a day (BID) | SUBCUTANEOUS | Status: DC

## 2011-07-19 MED ORDER — PROPRANOLOL HCL 40 MG PO TABS: 40.0000 mg | ORAL_TABLET | Freq: Two times a day (BID) | ORAL | Status: DC

## 2011-07-19 NOTE — Discharge Instructions (Signed)
Chronic Renal Insufficiency Chronic renal insufficiency (also called kidney failure) occurs when there is kidney damage done. The damage prevents the kidneys from working like they should.  The kidneys do many important things. They:  Filter waste out of the blood.   Regulate the amount of water and various salts in the blood stream.   Produce chemicals that:   Prompt the bone marrow to make red blood cells.   Regulate blood pressure.   Keep calcium in balance throughout the bones and the body.  When the kidneys are damaged, they can no longer filter waste products out of the blood. These substances build up in the blood, causing illness.  CAUSES   Diabetes.   High blood pressure.   Glomerular diseases: Conditions that damage the tiny blood vessels (glomeruli) within the kidneys, such as:   Membranous nephropathy.   IgA nephropathy.   Focal segmental glomerulosclerosis.   Poisons (such as overdoses or misuse of acetaminophen or NSAIDS, or exposure to other toxic substances).   Kidney injuries.   Kidney cancer or cancer that spreads to the kidney.   Medications such as NSAIDs. These problems are rare.   Kidney stones.   Alport disease.   Polycystic kidneys.  SYMPTOMS  Most people do not notice symptoms of kidney failure until their kidney function drops below about 30-40% of normal. Symptoms can include:  Weakness.   Tiredness.   Frequent urination.   Intense need to urinate.   Excess bruising.   Low urine production.   Blood in the urine.   Pain in the kidney area.   Feeling sick to your stomach (nausea).   Vomiting.   Unusual bleeding.   Numbness in hands and feet.   Swelling in legs, arms and face.   Confusion.  DIAGNOSIS  Your caregiver will look for signs of kidney failure. Tests to diagnose kidney failure may include:  Urine tests: May reveal the presence of blood, protein or sugar.   Blood tests: May show low red blood cell count  (anemia) or high levels of waste products (BUN and creatinine) that are normally filtered out of the bloodstream by the kidneys.   Imaging tests - These are tests that create pictures of the organs inside the abdomen, such as the kidneys. They may reveal masses growing in the kidneys or blockages to the flow of urine. Possible imaging tests may include:   Ultrasound.   CT scan.   MRI.   Intravenous pyelogram or IVP. This is a test that involves injecting dye into the bloodstream and then taking a series of x-rays of the kidneys. This allows the kidneys and other parts of the urinary system to be viewed more clearly.   Kidney biopsy - A small sample of kidney is removed using a special needle. The sample is examined for abnormalities under a microscope.  TREATMENT  Chronic kidney failure cannot usually be cured. The various symptoms are treated, and measures are taken to avoid further kidney damage. Treatment for mild to moderate kidney failure may include:  Medication for high blood pressure.   Good control of diabetes.   Medication and diet change to improve anemia.   A low-sodium, low-potassium, low-protein and/or low-cholesterol diet.   Limiting the quantity of liquids in the diet.  Treatment for more severe kidney failure may require:  Dialysis - Mechanical methods of filtering the blood.   Kidney transplant - An operation that removes the diseased kidney and replaces it with a donated kidney.  HOME CARE INSTRUCTIONS   Take medication as told by your caregiver.   Quit smoking if you are a smoker. Talk to your caregiver about a smoking cessation program.   Follow your prescribed diet.   If you are prescribed vitamins, take them as told.  SEEK IMMEDIATE MEDICAL CARE IF:  You start to produce less urine.   You notice blood in your urine.   You have increased pain.   You have increased weakness, fatigue or confusion.   You notice new swelling.   You develop a  fever.   You feel that you are having side effects of medicines prescribed.  Document Released: 01/22/2008 Document Revised: 04/03/2011 Document Reviewed: 05/06/2010 [facility] Patient Information 2012 [place], [place]. Insulin Treatment in Diabetes Diabetes is a lasting (chronic) disease. It occurs when the body does not properly use the sugar (glucose) that is released from food. Glucose levels are controlled by a hormone called insulin, which is made by your pancreas. Depending on the type of diabetes you have, either:  The pancreas does not make any insulin (type 1 diabetes).   The pancreas makes too little insulin, and the body cannot respond normally to the insulin that is made (type 2 diabetes).  Without insulin, death can occur. Diabetes requires lifelong monitoring and treatment. This document will discuss the role of insulin in your treatment and provide information about insulin.  LIFESTYLE CHOICES Lifestyle choices can affect both the control of your diabetes and your risk of complications from diabetes. Lifestyle choices are critically important in the overall management of diabetes. They are important even if you do not need to take insulin.  Eat a healthy diet. Ask for help if you need it.   Exercise regularly. Ask for help if you need it.   Diet and exercise can help:   Reduce the amount of insulin you need.   Your body to use your insulin more effectively.   Reduce your risk of high blood pressure (or reduce your blood pressure, if it is high).   Reduce your cholesterol level.   Reduce your weight.  Healthy lifestyle choices play an important role in controlling cardiovascular disease (heart attack, stroke, vascular disease, and others), which is a primary complication of diabetes. For optimal control of diabetes, you must reduce your cardiovascular risks and manage your blood sugar. MEDICATIONS BESIDES INSULIN  Your caregiver may recommend medicines for you in addition  to insulin. This will depend on 3 things:   Your diabetes type.   How well insulin alone meets your treatment goals.   Other health factors.  There are different types of medicines that help treat diabetes. The goal is to control your blood sugar the best way possible, which will reduce your risk of complications. Adding other medicines may also reduce the amount of insulin you need.  INSULIN  People with type 1 diabetes must take insulin to stay alive. Their body does not produce it. People with type 2 diabetes might require insulin in addition to, or instead of, other medicines. In either case, proper use of insulin is critical to control your diabetes.  There are a number of different types of insulin. Usually, you will give yourself injections, though others can be trained to give them to you. Some people have an insulin pump that delivers insulin continuously through a soft, flexible tube (canula) that is placedunder the skin of the abdomen.Other sites including the hips, thighs, or upper arms may also be used.  Using insulin requires  that you check your blood sugar several times a day. The exact number of times and time of day to check your blood sugar will vary depending on your type of diabetes, your type of insulin, and treatment goals. Your caregiver will direct you.  Generally, different insulins have different properties. The following is a general guide. Specifics will vary by product, and new products are introduced periodically.   Short-acting insulin starts working quickly (in as little as 5 minutes) and wears off in 3 to 6 hours (sometimes longer). This type of insulin works well when taken before a meal to bring your blood sugar quickly back to normal. There are several different types of short-acting insulin. Some work quickly and others last longer in your system.   Intermediate-acting insulin starts working in 2 hours and wears off after about 10 to18 hours. This insulin will  lower your blood sugar for a longer period of time, but will not be as effective in lowering your blood sugar right after a meal.   Long-acting insulin mimics the small amount of insulin that your pancreas usually produces throughout the day. You need to have some insulin present at all times, as it is crucial to the metabolism of brain cells and other cells. Long-acting insulin is meant to be used either once or twice a day. It is usually used in combination with other types of insulin, or in combination with other diabetes medicines.  Discuss the type of insulin you are taking with your caregiver or pharmacist. You will then be aware of when the insulin can be expected to peak and when it will wear off.  Your caregiver will usually have a strategy in mind when treating you with insulin. This will vary with your type of diabetes, your diabetes treatment goals, and your health history. It is important that you understand something about this strategy so you may be a partner in treating your diabetes. Here are some terms you might hear:  Basal insulin. You need to have a small amount of insulin present in your blood at all times. Sometimes oral medicines will be enough. For other people, and especially for people with type 1 diabetes, insulin is needed. Usually, intermediate-acting or long-acting insulin is used once or twice a day to accomplish this.   Prandial (meal-related) insulin. Your blood sugar will rise rapidly after a meal. Short-acting insulin can be used right before the meal to bring your blood sugar back to normal quickly. You might be instructed to adjust the amount of insulin depending on how much carbohydrate (starch) is in your meal.   Corrective insulin. You might be instructed to check your blood sugar at certain times of the day. You then might use a small amount of short-acting insulin to bring the blood sugar down to normal if it is elevated.   Tight control (also called intensive  therapy). Tight control is keeping your blood sugar as close to your target as possible and keeping it from going too high after meals. People with tight control of their diabetes may have fewer long-term complications from their diabetes.   Glycohemoglobin (also called glyco, glycosylated hemoglobin, Hemoglobin A1c, or A1c) level. This measures how well your blood sugar has been controlled during the past 1 to 3 months. It helps your caregiver see how effective your treatment is and decide if any changes are needed. Your caregiver will discuss your target glycohemoglobin with you.  Insulin treatment requires your careful attention. Treatment plans will be  different for different people. Some people do well with a simple program. Others require more complicated programs with multiple insulin injections daily. You will work with your caregiver to develop the best program for you. Regardless of your insulin treatment plan, you must also do your best on weight control, diet and food choices, exercise, blood pressure control, and cholesterol control.  BENEFITS OF DIABETES TREATMENT  Research shows that optimal treatment of your diabetes will reduce the risk of kidney, eye, and nerve complications. If you have type 1 diabetes, your risk of heart and vascular disease also decreases with good diabetes control. The better you control your blood sugars (and the lower your glycohemoglobin), the lower your risk of complications. RISKS OF INSULIN Although insulin treatment is important, it does have some risks. Insulin treatment may be complex, but it is critical for maintaining your good health. Frequent follow-up visits with your caregiver, at least early on, are usually needed.  Insulin can cause your blood sugar to go too low (hypoglycemia). This can be a dangerous complication that must be quickly recognized and treated.   Weight gain can occur.   Improper injection technique can cause hypoglycemia, skin  injury or irritation, or other problems. You must learn to inject insulin properly.   Other medicines used for diabetes can have other complications. Discuss your medicines and their complications with your caregiver.  GOALS OF DIABETES CARE  The goal of treating your diabetes is to allow you to live as long as you can with as few complications as possible. Several factors help you work towards this goal:  You should achieve a blood sugar as close to normal as possible without causing hypoglycemia. Generally, this means a glycohemoglobin of between 6% and 7%.   You should achieve and maintain an ideal body weight.   You should exercise regularly.   Your blood pressure should be under 130/80.   Your cholesterol should be controlled, with your LDL under 100. Your target may be different depending on your health factors.   You should not smoke.   You should be up to date on immunizations, including influenza and pneumonia.   You should be monitored for eye, kidney, heart, and vascular health regularly. Preventative medicines are sometimes used for these conditions.  Your specific goals may vary, depending on your health factors. You should discuss issues with your caregiver.  HOME CARE INSTRUCTIONS   Do not smoke.   Eat a healthy diet as instructed. Ask for help if you need it.   Exercise regularly. Ask for help if you need it.   Stay up to date on your immunizations.   See your caregiver on a regular basis.   Follow your diabetes care plan carefully. Take medicines and insulin as directed. Check your blood sugar as directed, and keep track of it in a log. Understand your glycohemoglobin and other diabetes goals. Understand how to detect and treat hypoglycemia.   Follow your care plan for blood pressure and elevated cholesterol if you have these problems.   Do your blood tests as directed. This is important and helps monitor your diabetes.   Check your feet every night for sores  or wounds.   See your eye doctor once a year.   If you have an illness that causes loss of appetite, vomiting, or diarrhea, you should speak with your caregiver about temporary changes in your insulin doses and other medicines.  SEEK MEDICAL CARE IF:  You are having problems keeping your blood  sugar at target range.   You are having episodes of hypoglycemia.   You are having side effects from your medicines.   You have symptoms of an illness that are not improving after 3 to 4 days.   You have a sore or wound that is not healing.   You notice a change in vision or a new problem with your vision.   You develop a fever of more than 100.5 F (38.1 C).  SEEK IMMEDIATE MEDICAL CARE IF:   Your blood sugar goes below 70, especially if you have confusion, lightheadedness, or other symptoms.   Your blood sugar is very high (as advised by your caregiver) twice in a row.   An unexplained oral temperature above 102 F (38.9 C) develops.   You pass out.   You have chest pain or trouble breathing.   You have a sudden, severe headache.   You have sudden weakness in one arm or leg.   You have sudden difficulty speaking or swallowing.   You develop vomiting or diarrhea that is getting worse or not improving after 1 day.  Document Released: 07/11/2008 Document Revised: 04/03/2011 Document Reviewed: 08/08/2010   Diabetes, Type 2 Diabetes is a long-lasting (chronic) disease. In type 2 diabetes, the pancreas does not make enough insulin (a hormone), and the body does not respond normally to the insulin that is made. This type of diabetes was also previously called adult-onset diabetes. It usually occurs after the age of 68, but it can occur at any age.  CAUSES  Type 2 diabetes happens because the pancreasis not making enough insulin or your body has trouble using the insulin that your pancreas does make properly. SYMPTOMS   Drinking more than usual.   Urinating more than usual.    Blurred vision.   Dry, itchy skin.   Frequent infections.   Feeling more tired than usual (fatigue).  DIAGNOSIS The diagnosis of type 2 diabetes is usually made by one of the following tests:  Fasting blood glucose test. You will not eat for at least 8 hours and then take a blood test.   Random blood glucose test. Your blood glucose (sugar) is checked at any time of the day regardless of when you ate.   Oral glucose tolerance test (OGTT). Your blood glucose is measured after you have not eaten (fasted) and then after you drink a glucose containing beverage.  TREATMENT   Healthy eating.   Exercise.   Medicine, if needed.   Monitoring blood glucose.   Seeing your caregiver regularly.  HOME CARE INSTRUCTIONS   Check your blood glucose at least once a day. More frequent monitoring may be necessary, depending on your medicines and on how well your diabetes is controlled. Your caregiver will advise you.   Take your medicine as directed by your caregiver.   Do not smoke.   Make wise food choices. Ask your caregiver for information. Weight loss can improve your diabetes.   Learn about low blood glucose (hypoglycemia) and how to treat it.   Get your eyes checked regularly.   Have a yearly physical exam. Have your blood pressure checked and your blood and urine tested.   Wear a pendant or bracelet saying that you have diabetes.   Check your feet every night for cuts, sores, blisters, and redness. Let your caregiver know if you have any problems.  SEEK MEDICAL CARE IF:   You have problems keeping your blood glucose in target range.   You have  problems with your medicines.   You have symptoms of an illness that do not improve after 24 hours.   You have a sore or wound that is not healing.   You notice a change in vision or a new problem with your vision.   You have a fever.  MAKE SURE YOU:  Understand these instructions.   Will watch your condition.   Will get  help right away if you are not doing well or get worse.  Document Released: 04/14/2005 Document Revised: 04/03/2011 Document Reviewed: 09/30/2010 [facility] Patient Information 2012 [place], [place]. [employer] Patient Information 2012 [place], [place].  Citalopram tablets You shouldn't be taking more than 20 mg of citalopram if you are over 60 because it can some cause some problems with your heart rhythm.  What is this medicine? CITALOPRAM (sye TAL oh pram) is a medicine for depression. This medicine may be used for other purposes; ask your health care provider or pharmacist if you have questions. What should I tell my health care provider before I take this medicine? They need to know if you have any of these conditions: -bipolar disorder or a family history of bipolar disorder -diabetes -heart disease -history of irregular heartbeat -kidney or liver disease -low levels of magnesium or potassium in the blood -receiving electroconvulsive therapy -seizures (convulsions) -suicidal thoughts or a previous suicide attempt -an unusual or allergic reaction to citalopram, escitalopram, other medicines, foods, dyes, or preservatives -pregnant or trying to become pregnant -breast-feeding How should I use this medicine? Take this medicine by mouth with a glass of water. Follow the directions on the prescription label. You can take it with or without food. Take your medicine at regular intervals. Do not take your medicine more often than directed. Do not stop taking except on your doctor's advice. A special MedGuide will be given to you by the pharmacist with each prescription and refill. Be sure to read this information carefully each time. Talk to your pediatrician regarding the use of this medicine in children. Special care may be needed. Patients over 31 years old may have a stronger reaction and need a smaller dose. Overdosage: If you think you have taken too much of this medicine contact a poison  control center or emergency room at once. NOTE: This medicine is only for you. Do not share this medicine with others. What if I miss a dose? If you miss a dose, take it as soon as you can. If it is almost time for your next dose, take only that dose. Do not take double or extra doses. What may interact with this medicine? Do not take this medicine with any of the following medications: -certain diet drugs like dexfenfluramine, fenfluramine, phentermine, sibutramine -cisapride -escitalopram -linezolid -MAOIs like Carbex, Eldepryl, Marplan, Nardil, and Parnate -methylene blue -pimozide -procarbazine -tryptophan This medicine may also interact with the following medications: -amphetamine or dextroamphetamine -arsenic trioxide -aspirin and aspirin-like drugs -carbamazepine -certain medicines for fungal infections like ketoconazole and itraconazole -certain medicines used to treat infections like chloroquine, clarithromycin, erythromycin, pentamidine -certain medicines for irregular heart beat like amiodarone, disopyramide, dofetilide, ibutilide, procainamide, propafenone, quinidine, sotalol -cimetidine -lithium -medicines for depression, anxiety, or psychotic disturbances -medicines for migraine headache like almotriptan, eletriptan, frovatriptan, naratriptan, rizatriptan, sumatriptan, zolmitriptan -medicines that treat or prevent blood clots like warfarin, enoxaparin, and dalteparin -medicines that treat HIV infection or AIDS -methadone -metoprolol -NSAIDs, medicines for pain and inflammation, like ibuprofen or naproxen -omeprazole -posaconazole -St. John's wort -tramadol This list may not describe all  possible interactions. Give your health care provider a list of all the medicines, herbs, non-prescription drugs, or dietary supplements you use. Also tell them if you smoke, drink alcohol, or use illegal drugs. Some items may interact with your medicine. What should I watch for  while using this medicine? Visit your doctor or health care professional for regular checks on your progress. Continue to take your medicine even if you do not feel better right away. It can take about 4 weeks before you feel the full effect of this medicine. Patients and their families should watch out for depression or thoughts of suicide that get worse. Also watch out for sudden or severe changes in feelings such as feeling anxious, agitated, panicky, irritable, hostile, aggressive, impulsive, severely restless, overly excited and hyperactive, or not being able to sleep. If this happens, especially at the beginning of antidepressant treatment or after a change in dose, call your health care professional. If you have been taking this medicine regularly for some time, do not suddenly stop taking it. You must gradually reduce the dose, or your symptoms may get worse. Ask your doctor or health care professional for advice. You may get drowsy or dizzy. Do not drive, use machinery, or do anything that needs mental alertness until you know how this medicine affects you. Do not stand or sit up quickly, especially if you are an older patient. This reduces the risk of dizzy or fainting spells. Alcohol may interfere with the effect of this medicine. Avoid alcoholic drinks. Do not treat yourself for coughs, colds, or allergies without asking your doctor or health care professional for advice. Some ingredients can increase possible side effects. Your mouth may get dry. Chewing sugarless gum or sucking hard candy, and drinking plenty of water will help. What side effects may I notice from receiving this medicine? Side effects that you should report to your doctor or health care professional as soon as possible: -allergic reactions like skin rash, itching or hives, swelling of the face, lips, or tongue -chest pain -confusion -dizziness -fast, irregular heartbeat -fast talking and excited feelings or actions that  are out of control -feeling faint or lightheaded, falls -hallucination, loss of contact with reality -seizures -shortness of breath -suicidal thoughts or other mood changes -unusual bleeding or bruising Side effects that usually do not require medical attention (report to your doctor or health care professional if they continue or are bothersome): -blurred vision -change in appetite -change in sex drive or performance -headache -increased sweating -nausea -trouble sleeping This list may not describe all possible side effects. Call your doctor for medical advice about side effects. You may report side effects to FDA at 1-800-FDA-1088. Where should I keep my medicine? Keep out of reach of children. Store at room temperature between 15 and 30 degrees C (59 and 86 degrees F). Throw away any unused medicine after the expiration date. NOTE: This sheet is a summary. It may not cover all possible information. If you have questions about this medicine, talk to your doctor, pharmacist, or health care provider.  2012, [employer] Standard. (12/21/2009 10:48:06 AM)

## 2011-07-19 NOTE — Progress Notes (Signed)
Discharge to home, alert and oriented, ambulatory, no complaints of any pain or discomfort. PIV removed no s/s  Of infiltration, no swelling noted on insertion site.D/C instructions ,dollow up appointments done, discussed and given to patient, mother at  Bedside, verbalized understanding.

## 2011-07-19 NOTE — Discharge Summary (Signed)
HOSPITAL DISCHARGE SUMMARY   @n   Mary Norris, 66 y.o., DOB 02/01/1946, MRN [id number]  Admission date: 07/17/2011 Discharge Date: 07/19/2011  Primary MD AMY MOON, PA-C  In [place], [place]  Admitting Physician Cleora Fleet, MD  Admission Diagnosis  Leukocytosis [288.60] Syncope [780.2] Weakness [780.79] weakness  Discharge Diagnoses:   Active Hospital Problems  Diagnoses Date Noted   . Chronic renal failure 07/19/2011   . First degree AV block 07/19/2011   . HTN (hypertension) 07/19/2011   . ARF (acute renal failure) 07/17/2011   . Diabetes mellitus 07/17/2011     Resolved Hospital Problems  Diagnoses Date Noted Date Resolved  . Syncope 07/17/2011 07/19/2011  . Leukocytosis 07/17/2011 07/19/2011  . Hypotension 07/17/2011 07/19/2011    Past Medical History  Diagnosis Date  . Diabetes mellitus   . Hypertension   . Headache     migraines    Past Surgical History  Procedure Date  . Abdominal hysterectomy   . Cholecystectomy   . Tubal ligation     Significant Tests:  See full reports for all details   CHEST - 2 VIEW  Comparison: None.  Findings: Trachea is midline. Heart size normal. Lungs are low in  volume but clear. No pleural fluid.  IMPRESSION:  No acute findings.  Original Report Authenticated By: Reyes Ivan, M.D.   Hospital Course See H&P, Labs, Consult and Test reports for all details. In brief, this patient was admitted for a presyncopal episode, weakness and dehydration.  Pt is 66 yo female with past medical history noted below who presents to [facility] ED with main concern of feeling generalized weakness that occurred this morning of admission while pt was in the bathroom. Pt denies similar episodes in the past and reports no known heart or neurologic problems. In addition, she reports being in her usual state of health, had no chest pain or shortness of breath, no abdominal or urinary concerns, no focal neurologic deficits, no headaches and no  visual changes. She also denies having done any strenuous physical activity prior to the event. She has not fallen and has not lost consciousness. She denies fevers, chills, other systmeic symptoms. Patient does report having diarrhea started on the day of admission and has had 2 episodes prior to arrival to ED. Pt was noted to be hypotensive upon EMS arrival with SBP in 70's.  Pt was found to be dehydrated and hypotensive.  She was admitted and ruled out for myocardial ischemia by cycling cardiac enzymes.  She was seen by PT and OT and cleared by them with no deficits.  Her medications were adjusted.  Her celexa was decreased because of her age being over 61 and it is not recommended to take a higher dose because of QT prolongation.  Pt had some 1st degree AV block seen on her EKG.  She was monitored on telemetry with no sig events reported.  She was on large doses of basal insulin which was cut back significantly because of low blood sugars.  She was taken off Victoza while in the hospital.  I explained to her that she needs to be on a basal bolus insulin program for safety and better glycemic control and that she needed to get to a diabetes specialist as soon as possible. She verbalized understanding and will ask her PCP.  Pt recovered after some IVFs.  Her hypotension resolved.  Her leukocytosis improved as well.  No infections found.  She began asking to go home.  She  will be discharged to have close follow up with her PCP.     Active Hospital Problems  Diagnoses Date Noted   . Chronic renal failure 07/19/2011   . First degree AV block 07/19/2011   . HTN (hypertension) 07/19/2011   . ARF (acute renal failure) 07/17/2011   . Diabetes mellitus 07/17/2011     Resolved Hospital Problems  Diagnoses Date Noted Date Resolved  . Syncope 07/17/2011 07/19/2011  . Leukocytosis 07/17/2011 07/19/2011  . Hypotension 07/17/2011 07/19/2011     Today's Assessment:   Subjective:   Steward Drone Durkin  Pt  ambulating well with no problems.  Pt is asking to go home.  No further events.   BPs have rebounded. No further hypotension.   Objective:   Blood pressure 153/88, pulse 64, temperature 98.3 F (36.8 C), temperature source Oral, resp. rate 16, height 5\' 2"  (1.575 m), weight 85.186 kg (187 lb 12.8 oz), SpO2 99.00%.  Intake/Output Summary (Last 24 hours) at 07/19/11 1200 Last data filed at 07/19/11 0700  Gross per 24 hour  Intake   1956 ml  Output   1650 ml  Net    306 ml    Exam General - alert, NAD, cooperative HEENT - MMM Neck - supple, no JVD Lungs - BBS clear CV - normal s1, s2 sounds Abd - soft, nondistended, normal BS, no masses Ext - no cyanosis or clubbing.    Lab Results  Component Value Date   WBC 11.9* 07/18/2011   HGB 10.5* 07/18/2011   HCT 31.1* 07/18/2011   PLT 196 07/18/2011   CMP: Lab Results  Component Value Date   NA 138 07/18/2011   K 3.9 07/18/2011   CL 109 07/18/2011   CO2 23 07/18/2011   BUN 23 07/18/2011   CREATININE 1.43* 07/18/2011  .  Discharge Instructions    Please see Discharge AVS form.  Recheck CBC and BMP with PCP next week.   DISCHARGE MEDICATION: Medication List  As of 07/19/2011 12:00 PM   STOP taking these medications         furosemide 20 MG tablet      traMADol 50 MG tablet         TAKE these medications         amitriptyline 50 MG tablet   Commonly known as: ELAVIL   Take 50 mg by mouth at bedtime. sleep      citalopram 20 MG tablet   Commonly known as: CELEXA   Take 1 tablet (20 mg total) by mouth daily.      Fish Oil 1000 MG Cpdr   Take 1 capsule by mouth 2 (two) times daily.      HYDROcodone-acetaminophen 5-500 MG per tablet   Commonly known as: VICODIN   Take 1 tablet by mouth every 6 (six) hours as needed. pain      insulin detemir 100 UNIT/ML injection   Commonly known as: LEVEMIR   Inject 30 Units into the skin 2 (two) times daily.      Iron 325 (65 FE) MG Tabs   Take 1 tablet by mouth daily.       lansoprazole 30 MG capsule   Commonly known as: PREVACID   Take 30 mg by mouth daily.      lisinopril 5 MG tablet   Commonly known as: PRINIVIL,ZESTRIL   Take 5 mg by mouth daily.      multivitamin with minerals tablet   Take 1 tablet by mouth daily.  pravastatin 40 MG tablet   Commonly known as: PRAVACHOL   Take 40 mg by mouth 2 (two) times daily.      propranolol 40 MG tablet   Commonly known as: INDERAL   Take 1 tablet (40 mg total) by mouth 2 (two) times daily.      VICTOZA [place]   Inject 1.8 mg into the skin daily. PT USES THE FLEX PEN            Disposition and Follow-up:  Discharge Orders    Future Orders Please Complete By Expires   Increase activity slowly      Discharge instructions      Comments:   CHECK BLOOD SUGARS 4 TIMES PER DAY AND REPORT TO PRIMARY CARE PHYSICIAN FOLLOW UP WITH PCP IN 1 WEEK TO CHECK BP AND DIABETES NOTICE CHANGES MADE TO MEDICATIONS RETURN IF SYMPTOMS RECUR, WORSEN OR NEW PROBLEMS DEVELOP. GET A DIABETES SPECIALIST AND MAKE AN APPT AS SOON AS POSSIBLE.   Call MD for:  severe uncontrolled pain      Call MD for:  extreme fatigue      Call MD for:  persistant dizziness or light-headedness      Call MD for:  difficulty breathing, headache or visual disturbances      Discontinue IV        Follow-up Information    Follow up with MOON,AMY, NP in 1 week. (recheck BP and Diabetes)    Contact information:   [address] [place] [address] [phone]           The risks, benefits, and possible side effects of all treatments and tests were explained to the patient.  The patient verbalized understanding.  The importance of close follow up with the primary care medical provider was explained clearly to the patient.  The patient verbalized understanding.  The patient was given instructions to return if symptoms recur, worsen or new changes develop.  The patient verbalized understanding.   Cleora Fleet, MD, CDE, FAAFP [facility] [place], [place]   Total Time spent reviewing critical document, reviewing this patient's comprehensive hospitalization, arranging follow up and coordination of care, reviewing data and todays exam greater than 37 minutes.   Signed: Berdina Cheever 07/19/2011 12:00 PM

## 2011-07-23 MED FILL — Insulin Detemir Soln Pen-injector 100 Unit/ML: SUBCUTANEOUS | Qty: 0.5 | Status: AC

## 2011-09-01 ENCOUNTER — Other Ambulatory Visit (INDEPENDENT_AMBULATORY_CARE_PROVIDER_SITE_OTHER): Payer: Medicare Other

## 2011-09-01 ENCOUNTER — Encounter: Payer: Self-pay | Admitting: Endocrinology

## 2011-09-01 ENCOUNTER — Ambulatory Visit (INDEPENDENT_AMBULATORY_CARE_PROVIDER_SITE_OTHER): Payer: Medicare Other | Admitting: Endocrinology

## 2011-09-01 VITALS — BP 112/84 | HR 81 | Temp 97.8°F | Ht 62.0 in | Wt 188.0 lb

## 2011-09-01 DIAGNOSIS — E041 Nontoxic single thyroid nodule: Secondary | ICD-10-CM

## 2011-09-01 DIAGNOSIS — E119 Type 2 diabetes mellitus without complications: Secondary | ICD-10-CM

## 2011-09-01 NOTE — Patient Instructions (Addendum)
good diet and exercise habits significanly improve the control of your diabetes.  please let me know if you wish to be referred to a dietician.  high blood sugar is very risky to your health.  you should see an eye doctor every year. controlling your blood pressure and cholesterol drastically reduces the damage diabetes does to your body.  this also applies to quitting smoking.  please discuss these with your doctor.  you should take an aspirin every day, unless you have been advised by a doctor not to. check your blood sugar twice a day.  vary the time of day when you check, between before the 3 meals, and at bedtime.  also check if you have symptoms of your blood sugar being too high or too low.  please keep a record of the readings and bring it to your next appointment here.  please call us sooner if your blood sugar goes below 70, or if it stays over 200. Let's check the thyroid ultrasound.  you will receive a phone call, about a day and time for an appointment. blood tests are being requested for you today.  You will receive a letter with results. Try stopping the victoza on a trial basis, to see if it helps your stomach symptoms.  we will need to take this complex situation in stages.   For now, increase the levemir to 35 units twice a day.  The if necessary, increase to 40 unit twice a day.  Please come back for a follow-up appointment in 2 weeks.   Cc dr Orene Desanctis

## 2011-09-01 NOTE — Progress Notes (Signed)
Subjective:    Patient ID: Mary Norris, female    DOB: June 13, 1945, 66 y.o.   MRN: [id number]  HPI pt states 15 years h/o dm.  it is complicated by renal insufficiency.  he has been on insulin x 8 years.  She says cbg's vary from 150-200.  There is no trend throughout the day.  She has 1 month of intermittent moderate abdominal pain, but no assoc n/v.  She recently has EGD to eval this. pt says her diet is good, but her exercise is not good.  Past Medical History  Diagnosis Date  . Diabetes mellitus   . Hypertension   . Headache     migraines  . Arthritis   . Chicken pox   . Depression   . Allergy   . Hyperlipidemia   . History of colon polyps     Past Surgical History  Procedure Date  . Abdominal hysterectomy   . Cholecystectomy   . Tubal ligation   . Appendectomy 1963  . Breast biopsy 1999    History   Social History  . Marital Status: Married    Spouse Name: N/A    Number of Children: 2  . Years of Education: 12   Occupational History  . Retired    Social History Main Topics  . Smoking status: Never Smoker   . Smokeless tobacco: Never Used  . Alcohol Use: No  . Drug Use: No  . Sexually Active: No   Other Topics Concern  . Not on file   Social History Narrative  . No narrative on file    Current Outpatient Prescriptions on File Prior to Visit  Medication Sig Dispense Refill  . amitriptyline (ELAVIL) 50 MG tablet Take 50 mg by mouth at bedtime. sleep      . citalopram (CELEXA) 20 MG tablet Take 1 tablet (20 mg total) by mouth daily.  30 tablet  0  . Ferrous Sulfate (IRON) 325 (65 FE) MG TABS Take 1 tablet by mouth daily.      Marland Kitchen HYDROcodone-acetaminophen (VICODIN) 5-500 MG per tablet Take 1 tablet by mouth every 6 (six) hours as needed. pain      . insulin detemir (LEVEMIR) 100 UNIT/ML injection Inject 30 Units into the skin 2 (two) times daily.  10 mL    . lansoprazole (PREVACID) 30 MG capsule Take 30 mg by mouth daily.      . Liraglutide  (VICTOZA [place]) Inject 1.8 mg into the skin daily. PT USES THE FLEX PEN      . lisinopril (PRINIVIL,ZESTRIL) 5 MG tablet Take 5 mg by mouth daily.      . Multiple Vitamins-Minerals (MULTIVITAMIN WITH MINERALS) tablet Take 1 tablet by mouth daily.      . Omega-3 Fatty Acids (FISH OIL) 1000 MG CPDR Take 1 capsule by mouth 2 (two) times daily.      . pravastatin (PRAVACHOL) 40 MG tablet Take 40 mg by mouth 2 (two) times daily.      . propranolol (INDERAL) 40 MG tablet Take 1 tablet (40 mg total) by mouth 2 (two) times daily.  60 tablet  0  . sucralfate (CARAFATE) 1 G tablet Take 1 g by mouth 4 (four) times daily. Before meals and at bedtime        Allergies  Allergen Reactions  . Codeine     Chest pain  . Morphine And Related     Chest pain     Family History  Problem Relation Age of  Onset  . Cancer Father     Prostate ancer  . Cancer Other     Breast Cancer-Parent  . Hypertension Other     Parent  DM: grandmother  BP 112/84  Pulse 81  Temp(Src) 97.8 F (36.6 C) (Oral)  Ht 5\' 2"  (1.575 m)  Wt 188 lb (85.276 kg)  BMI 34.39 kg/m2  SpO2 97%  Review of Systems denies blurry vision, chest pain, sob, diarrhea, urinary frequency, cramps, excessive diaphoresis, memory loss, menopausal sxs, and rhinorrhea.  She has lost weight, due to abd sxs.  She has headache, easy bruising, and depression.    Objective:   Physical Exam VS: see vs page GEN: no distress HEAD: head: no deformity eyes: no periorbital swelling, no proptosis external nose and ears are normal mouth: no lesion seen NECK: ? Of 1.5 cm left thyroid nodule CHEST WALL: no deformity LUNGS:  Clear to auscultation BREASTS:  No mass.  No d/c CV: reg rate and rhythm, no murmur ABD: abdomen is soft, nontender.  no hepatosplenomegaly.  not distended.  no hernia GENITALIA:  Normal external female.  Normal bimanual exam RECTAL: normal external and internal exam.  heme neg MUSCULOSKELETAL: muscle bulk and strength are grossly  normal.  no obvious joint swelling.  gait is normal and steady EXTEMITIES: no deformity.  no ulcer on the feet.  feet are of normal color and temp.  no edema PULSES: dorsalis pedis intact bilat.  no carotid bruit NEURO:  cn 2-12 grossly intact.   readily moves all 4's.  sensation is intact to touch on the feet SKIN:  Normal texture and temperature.  No rash or suspicious lesion is visible.   NODES:  None palpable at the neck PSYCH: alert, oriented x3.  Does not appear anxious nor depressed. Lab Results  Component Value Date   HGBA1C 7.5* 09/01/2011      Assessment & Plan:  DM.  She needs increased rx, if it can be done with a regimen that avoids or minimizes hypoglycemia. GI sxs, possibly due to victoza Depression.  This complicates the rx of DM Thyroid nodule, incidentally noted

## 2011-09-02 ENCOUNTER — Telehealth: Payer: Self-pay | Admitting: *Deleted

## 2011-09-02 NOTE — Telephone Encounter (Signed)
Called pt to inform of lab results, pt informed (letter also mailed to pt). 

## 2011-09-15 ENCOUNTER — Ambulatory Visit (INDEPENDENT_AMBULATORY_CARE_PROVIDER_SITE_OTHER): Payer: Medicare Other | Admitting: Endocrinology

## 2011-09-15 ENCOUNTER — Encounter: Payer: Self-pay | Admitting: Endocrinology

## 2011-09-15 VITALS — BP 112/80 | HR 73 | Temp 98.3°F | Ht 62.0 in | Wt 183.0 lb

## 2011-09-15 DIAGNOSIS — E042 Nontoxic multinodular goiter: Secondary | ICD-10-CM

## 2011-09-15 DIAGNOSIS — E119 Type 2 diabetes mellitus without complications: Secondary | ICD-10-CM

## 2011-09-15 HISTORY — DX: Nontoxic multinodular goiter: E04.2

## 2011-09-15 MED ORDER — INSULIN DETEMIR 100 UNIT/ML ~~LOC~~ SOLN: 50.0000 [IU] | Freq: Every day | SUBCUTANEOUS | Status: DC

## 2011-09-15 MED ORDER — INSULIN ASPART 100 UNIT/ML ~~LOC~~ SOLN: 20.0000 [IU] | Freq: Three times a day (TID) | SUBCUTANEOUS | Status: DC

## 2011-09-15 NOTE — Patient Instructions (Signed)
Reduce the levemir to 50 units, at bedtime only. Start novolog 20 units 3x a day (just before each meal). Please come back for a follow-up appointment in 2 weeks. check your blood sugar 2 times a day.  vary the time of day when you check, between before the 3 meals, and at bedtime.  also check if you have symptoms of your blood sugar being too high or too low.  please keep a record of the readings and bring it to your next appointment here.  please call us sooner if your blood sugar goes below 70, or if it stays over 200.

## 2011-09-15 NOTE — Progress Notes (Signed)
Subjective:    Patient ID: Mary Norris, female    DOB: [birth year], 66 y.o.   MRN: [id number]  HPI pt returns for f/u of IDDM (dx'ed 1997; complicated by renal insufficiency). Pt says abd pain resolved with discontinuation of victoza.  she brings a record of her cbg's which i have reviewed today.  It varies from 92-500.  There is no trend throughout the day.   Past Medical History  Diagnosis Date  . Diabetes mellitus   . Hypertension   . Headache     migraines  . Arthritis   . Chicken pox   . Depression   . Allergy   . Hyperlipidemia   . History of colon polyps     Past Surgical History  Procedure Date  . Abdominal hysterectomy   . Cholecystectomy   . Tubal ligation   . Appendectomy 1963  . Breast biopsy 1999    History   Social History  . Marital Status: Married    Spouse Name: N/A    Number of Children: 2  . Years of Education: 12   Occupational History  . Retired    Social History Main Topics  . Smoking status: Never Smoker   . Smokeless tobacco: Never Used  . Alcohol Use: No  . Drug Use: No  . Sexually Active: No   Other Topics Concern  . Not on file   Social History Narrative  . No narrative on file    Current Outpatient Prescriptions on File Prior to Visit  Medication Sig Dispense Refill  . amitriptyline (ELAVIL) 50 MG tablet Take 50 mg by mouth at bedtime. sleep      . citalopram (CELEXA) 20 MG tablet Take 1 tablet (20 mg total) by mouth daily.  30 tablet  0  . Ferrous Sulfate (IRON) 325 (65 FE) MG TABS Take 1 tablet by mouth daily.      Marland Kitchen HYDROcodone-acetaminophen (VICODIN) 5-500 MG per tablet Take 1 tablet by mouth every 6 (six) hours as needed. pain      . insulin aspart (NOVOLOG FLEXPEN) 100 UNIT/ML injection Inject 20 Units into the skin 3 (three) times daily before meals.  180 mL  12  . insulin detemir (LEVEMIR) 100 UNIT/ML injection Inject 50 Units into the skin at bedtime.  50 mL  3  . lansoprazole (PREVACID) 30 MG capsule Take 30 mg  by mouth daily.      Marland Kitchen lisinopril (PRINIVIL,ZESTRIL) 5 MG tablet Take 5 mg by mouth daily.      . Multiple Vitamins-Minerals (MULTIVITAMIN WITH MINERALS) tablet Take 1 tablet by mouth daily.      . Omega-3 Fatty Acids (FISH OIL) 1000 MG CPDR Take 1 capsule by mouth 2 (two) times daily.      . pravastatin (PRAVACHOL) 40 MG tablet Take 40 mg by mouth 2 (two) times daily.      . propranolol (INDERAL) 40 MG tablet Take 1 tablet (40 mg total) by mouth 2 (two) times daily.  60 tablet  0  . sucralfate (CARAFATE) 1 G tablet Take 1 g by mouth 4 (four) times daily. Before meals and at bedtime      . traMADol (ULTRAM) 50 MG tablet Take 50 mg by mouth as needed.        Allergies  Allergen Reactions  . Codeine     Chest pain  . Morphine And Related     Chest pain     Family History  Problem Relation Age of Onset  .  Cancer Father     Prostate ancer  . Cancer Other     Breast Cancer-Parent  . Hypertension Other     Parent    BP 112/80  Pulse 73  Temp(Src) 98.3 F (36.8 C) (Oral)  Ht 5\' 2"  (1.575 m)  Wt 183 lb (83.008 kg)  BMI 33.47 kg/m2  SpO2 98%    Review of Systems denies hypoglycemia    Objective:   Physical Exam VITAL SIGNS:  See vs page GENERAL: no distress PSYCH: Alert and oriented x 3.  Does not appear anxious nor depressed.        Assessment & Plan:  abd pain, apparently due to victoza, resolved off it. DM.  needs increased rx

## 2011-10-02 ENCOUNTER — Encounter: Payer: Self-pay | Admitting: Endocrinology

## 2011-10-02 ENCOUNTER — Ambulatory Visit (INDEPENDENT_AMBULATORY_CARE_PROVIDER_SITE_OTHER): Payer: Medicare Other | Admitting: Endocrinology

## 2011-10-02 VITALS — BP 122/78 | HR 68 | Temp 99.1°F | Ht 62.0 in | Wt 182.0 lb

## 2011-10-02 DIAGNOSIS — N058 Unspecified nephritic syndrome with other morphologic changes: Secondary | ICD-10-CM

## 2011-10-02 DIAGNOSIS — E1029 Type 1 diabetes mellitus with other diabetic kidney complication: Secondary | ICD-10-CM

## 2011-10-02 DIAGNOSIS — E1065 Type 1 diabetes mellitus with hyperglycemia: Secondary | ICD-10-CM

## 2011-10-02 NOTE — Progress Notes (Signed)
Subjective:    Patient ID: Mary Norris, female    DOB: [birth year], 66 y.o.   MRN: [id number]  HPI pt returns for f/u of IDDM (dx'ed 1997; complicated by renal insufficiency). She has mild hypoglycemia before lunch.  She was having hypoglycemia in the early hrs of the am, but she ears at hs to prevent this.  she brings a record of her cbg's which i have reviewed today. Past Medical History  Diagnosis Date  . Diabetes mellitus   . Hypertension   . Headache     migraines  . Arthritis   . Chicken pox   . Depression   . Allergy   . Hyperlipidemia   . History of colon polyps     Past Surgical History  Procedure Date  . Abdominal hysterectomy   . Cholecystectomy   . Tubal ligation   . Appendectomy 1963  . Breast biopsy 1999    History   Social History  . Marital Status: Married    Spouse Name: N/A    Number of Children: 2  . Years of Education: 12   Occupational History  . Retired    Social History Main Topics  . Smoking status: Never Smoker   . Smokeless tobacco: Never Used  . Alcohol Use: No  . Drug Use: No  . Sexually Active: No   Other Topics Concern  . Not on file   Social History Narrative  . No narrative on file    Current Outpatient Prescriptions on File Prior to Visit  Medication Sig Dispense Refill  . amitriptyline (ELAVIL) 50 MG tablet Take 50 mg by mouth at bedtime. sleep      . citalopram (CELEXA) 20 MG tablet Take 1 tablet (20 mg total) by mouth daily.  30 tablet  0  . Ferrous Sulfate (IRON) 325 (65 FE) MG TABS Take 1 tablet by mouth daily.      Marland Kitchen HYDROcodone-acetaminophen (VICODIN) 5-500 MG per tablet Take 1 tablet by mouth every 6 (six) hours as needed. pain      . insulin aspart (NOVOLOG) 100 UNIT/ML injection 3x a day (just before each meal) 15-20-20 units.      . insulin detemir (LEVEMIR) 100 UNIT/ML injection Inject 40 Units into the skin at bedtime.      . lansoprazole (PREVACID) 30 MG capsule Take 30 mg by mouth daily.      Marland Kitchen  lisinopril (PRINIVIL,ZESTRIL) 5 MG tablet Take 5 mg by mouth daily.      . Multiple Vitamins-Minerals (MULTIVITAMIN WITH MINERALS) tablet Take 1 tablet by mouth daily.      . Omega-3 Fatty Acids (FISH OIL) 1000 MG CPDR Take 1 capsule by mouth 2 (two) times daily.      . pravastatin (PRAVACHOL) 40 MG tablet Take 40 mg by mouth 2 (two) times daily.      . propranolol (INDERAL) 40 MG tablet Take 1 tablet (40 mg total) by mouth 2 (two) times daily.  60 tablet  0  . sucralfate (CARAFATE) 1 G tablet Take 1 g by mouth 4 (four) times daily. Before meals and at bedtime      . traMADol (ULTRAM) 50 MG tablet Take 50 mg by mouth as needed.        Allergies  Allergen Reactions  . Codeine     Chest pain  . Morphine And Related     Chest pain     Family History  Problem Relation Age of Onset  . Cancer Father  Prostate ancer  . Cancer Other     Breast Cancer-Parent  . Hypertension Other     Parent    BP 122/78  Pulse 68  Temp 99.1 F (37.3 C) (Oral)  Ht 5\' 2"  (1.575 m)  Wt 182 lb (82.555 kg)  BMI 33.29 kg/m2  SpO2 96%  Review of Systems Denies loc    Objective:   Physical Exam VITAL SIGNS:  See vs page GENERAL: no distress PSYCH: Alert and oriented x 3.  Does not appear anxious nor depressed.     Assessment & Plan:  DM, apparently overcontrolled.

## 2011-10-02 NOTE — Patient Instructions (Addendum)
Reduce the levemir to 40 units, at bedtime only. Reduce the novolog to 3x a day (just before each meal) 15-20-20 units.  Please come back for a follow-up appointment in 1 month. check your blood sugar 2 times a day.  vary the time of day when you check, between before the 3 meals, and at bedtime.  also check if you have symptoms of your blood sugar being too high or too low.  please keep a record of the readings and bring it to your next appointment here.  please call us sooner if your blood sugar goes below 70, or if it stays over 200.

## 2011-10-08 DIAGNOSIS — E1122 Type 2 diabetes mellitus with diabetic chronic kidney disease: Secondary | ICD-10-CM

## 2011-10-08 DIAGNOSIS — E1065 Type 1 diabetes mellitus with hyperglycemia: Secondary | ICD-10-CM

## 2011-10-08 DIAGNOSIS — E1029 Type 1 diabetes mellitus with other diabetic kidney complication: Secondary | ICD-10-CM | POA: Insufficient documentation

## 2011-10-08 HISTORY — DX: Type 1 diabetes mellitus with hyperglycemia: E10.65

## 2011-10-08 HISTORY — DX: Type 2 diabetes mellitus with diabetic chronic kidney disease: E11.22

## 2011-10-13 ENCOUNTER — Telehealth: Payer: Self-pay | Admitting: *Deleted

## 2011-10-13 NOTE — Telephone Encounter (Signed)
Pt left vm stating that Dr. Everardo All advised her to call in when her  CBGs were above 200 and they are. She is requesting advisement.

## 2011-10-13 NOTE — Telephone Encounter (Signed)
Pt informed SAE is out of office until next Monday and to contact PCP for advisement regarding insulin adjustment. Pt verbalized understanding.

## 2011-10-31 ENCOUNTER — Ambulatory Visit (INDEPENDENT_AMBULATORY_CARE_PROVIDER_SITE_OTHER): Payer: Medicare Other | Admitting: Endocrinology

## 2011-10-31 ENCOUNTER — Other Ambulatory Visit (INDEPENDENT_AMBULATORY_CARE_PROVIDER_SITE_OTHER): Payer: Medicare Other

## 2011-10-31 VITALS — BP 142/82 | HR 69 | Temp 97.0°F | Resp 18 | Wt 183.0 lb

## 2011-10-31 DIAGNOSIS — N058 Unspecified nephritic syndrome with other morphologic changes: Secondary | ICD-10-CM

## 2011-10-31 DIAGNOSIS — E1029 Type 1 diabetes mellitus with other diabetic kidney complication: Secondary | ICD-10-CM

## 2011-10-31 DIAGNOSIS — E1065 Type 1 diabetes mellitus with hyperglycemia: Secondary | ICD-10-CM

## 2011-10-31 LAB — HEMOGLOBIN A1C: Hgb A1c MFr Bld: 9.1 % — ABNORMAL HIGH (ref 4.6–6.5)

## 2011-10-31 NOTE — Patient Instructions (Addendum)
continue the levemir 40 units at bedtime only. Reduce the novolog to 3x a day (just before each meal) 15-15-20 units.  Please come back for a follow-up appointment in 6 weeks.   check your blood sugar 2 times a day.  vary the time of day when you check, between before the 3 meals, and at bedtime.  also check if you have symptoms of your blood sugar being too high or too low.  please keep a record of the readings and bring it to your next appointment here.  please call us sooner if your blood sugar goes below 70, or if it stays over 200.   blood tests are being requested for you today.  You will receive a letter with results.

## 2011-10-31 NOTE — Progress Notes (Signed)
Subjective:    Patient ID: Mary Norris, female    DOB: 07/06/1945, 66 y.o.   MRN: [id number]  HPI pt returns for f/u of IDDM (dx'ed 1997; complicated by renal insufficiency).  she brings a record of her cbg's which i have reviewed today.  It is mildly low 1-2x/week, in the afternoon (she checks only in the morning and afternoon).  In the am, it varies from 89-300, but most are in the 100's.   Past Medical History  Diagnosis Date  . Diabetes mellitus   . Hypertension   . Headache     migraines  . Arthritis   . Chicken pox   . Depression   . Allergy   . Hyperlipidemia   . History of colon polyps     Past Surgical History  Procedure Date  . Abdominal hysterectomy   . Cholecystectomy   . Tubal ligation   . Appendectomy 1963  . Breast biopsy 1999    History   Social History  . Marital Status: Married    Spouse Name: N/A    Number of Children: 2  . Years of Education: 12   Occupational History  . Retired    Social History Main Topics  . Smoking status: Never Smoker   . Smokeless tobacco: Never Used  . Alcohol Use: No  . Drug Use: No  . Sexually Active: No   Other Topics Concern  . Not on file   Social History Narrative  . No narrative on file    Current Outpatient Prescriptions on File Prior to Visit  Medication Sig Dispense Refill  . amitriptyline (ELAVIL) 50 MG tablet Take 50 mg by mouth at bedtime. sleep      . citalopram (CELEXA) 20 MG tablet Take 1 tablet (20 mg total) by mouth daily.  30 tablet  0  . Ferrous Sulfate (IRON) 325 (65 FE) MG TABS Take 1 tablet by mouth daily.      Marland Kitchen HYDROcodone-acetaminophen (VICODIN) 5-500 MG per tablet Take 1 tablet by mouth every 6 (six) hours as needed. pain      . insulin aspart (NOVOLOG) 100 UNIT/ML injection 3x a day (just before each meal) 15-15-20 units.      . insulin detemir (LEVEMIR) 100 UNIT/ML injection Inject 40 Units into the skin at bedtime.      . lansoprazole (PREVACID) 30 MG capsule Take 30 mg by  mouth daily.      Marland Kitchen lisinopril (PRINIVIL,ZESTRIL) 5 MG tablet Take 5 mg by mouth daily.      . Multiple Vitamins-Minerals (MULTIVITAMIN WITH MINERALS) tablet Take 1 tablet by mouth daily.      . Omega-3 Fatty Acids (FISH OIL) 1000 MG CPDR Take 1 capsule by mouth 2 (two) times daily.      . pravastatin (PRAVACHOL) 40 MG tablet Take 40 mg by mouth 2 (two) times daily.      . propranolol (INDERAL) 40 MG tablet Take 1 tablet (40 mg total) by mouth 2 (two) times daily.  60 tablet  0  . traMADol (ULTRAM) 50 MG tablet Take 50 mg by mouth as needed.      . sucralfate (CARAFATE) 1 G tablet Take 1 g by mouth 4 (four) times daily. Before meals and at bedtime        Allergies  Allergen Reactions  . Codeine     Chest pain  . Morphine And Related     Chest pain     Family History  Problem Relation Age  of Onset  . Cancer Father     Prostate ancer  . Cancer Other     Breast Cancer-Parent  . Hypertension Other     Parent    BP 142/82  Pulse 69  Temp 97 F (36.1 C) (Oral)  Resp 18  Wt 183 lb (83.008 kg)  SpO2 97%  Review of Systems Denies loc    Objective:   Physical Exam VITAL SIGNS:  See vs page GENERAL: no distress PSYCH: Alert and oriented x 3.  Does not appear anxious nor depressed.    Lab Results  Component Value Date   HGBA1C 9.1* 10/31/2011      Assessment & Plan:  DM.  Hypoglycemia is less, but control is worse.  Next step is more self-glucose monitoring

## 2011-10-31 NOTE — Progress Notes (Signed)
  Subjective:    Patient ID: Mary Norris, female    DOB: Jul 19, 1945, 66 y.o.   MRN: [id number]  HPI    Review of Systems     Objective:   Physical Exam Correction: breast/gyn/rectal exams not done today      Assessment & Plan:

## 2011-11-01 ENCOUNTER — Encounter: Payer: Self-pay | Admitting: Endocrinology

## 2011-11-03 ENCOUNTER — Telehealth: Payer: Self-pay | Admitting: *Deleted

## 2011-11-03 NOTE — Telephone Encounter (Signed)
Called pt to inform of lab results, no answer/unable to leave message (letter also mailed to pt).  

## 2011-11-04 NOTE — Telephone Encounter (Signed)
Called pt to inform of lab results, no answer/unable to leave message.  

## 2011-11-05 NOTE — Telephone Encounter (Signed)
Called pt to inform of lab results, no answer/unable to leave message (letter also mailed to pt). Closing phone note.

## 2011-12-15 ENCOUNTER — Ambulatory Visit (INDEPENDENT_AMBULATORY_CARE_PROVIDER_SITE_OTHER): Payer: Medicare Other | Admitting: Endocrinology

## 2011-12-15 ENCOUNTER — Encounter: Payer: Self-pay | Admitting: Endocrinology

## 2011-12-15 VITALS — BP 122/82 | HR 65 | Temp 97.6°F | Ht 62.0 in | Wt 183.0 lb

## 2011-12-15 DIAGNOSIS — E1029 Type 1 diabetes mellitus with other diabetic kidney complication: Secondary | ICD-10-CM

## 2011-12-15 DIAGNOSIS — E1065 Type 1 diabetes mellitus with hyperglycemia: Secondary | ICD-10-CM

## 2011-12-15 DIAGNOSIS — N058 Unspecified nephritic syndrome with other morphologic changes: Secondary | ICD-10-CM

## 2011-12-15 NOTE — Progress Notes (Signed)
Subjective:    Patient ID: Mary Norris, female    DOB: 1945-12-16, 66 y.o.   MRN: [id number]  HPI pt returns for f/u of IDDM (dx'ed 1997; complicated by renal insufficiency).  no cbg record, but states cbg's vary 90-165.  It is in general highest at hs, and lowest in am.  She has had only 1 episode of hypoglycemia, and it was mild, before lunch.    Past Medical History  Diagnosis Date  . Diabetes mellitus   . Hypertension   . Headache     migraines  . Arthritis   . Chicken pox   . Depression   . Allergy   . Hyperlipidemia   . History of colon polyps     Past Surgical History  Procedure Date  . Abdominal hysterectomy   . Cholecystectomy   . Tubal ligation   . Appendectomy 1963  . Breast biopsy 1999    History   Social History  . Marital Status: Married    Spouse Name: N/A    Number of Children: 2  . Years of Education: 12   Occupational History  . Retired    Social History Main Topics  . Smoking status: Never Smoker   . Smokeless tobacco: Never Used  . Alcohol Use: No  . Drug Use: No  . Sexually Active: No   Other Topics Concern  . Not on file   Social History Narrative  . No narrative on file    Current Outpatient Prescriptions on File Prior to Visit  Medication Sig Dispense Refill  . amitriptyline (ELAVIL) 50 MG tablet Take 50 mg by mouth at bedtime. sleep      . citalopram (CELEXA) 20 MG tablet Take 1 tablet (20 mg total) by mouth daily.  30 tablet  0  . Ferrous Sulfate (IRON) 325 (65 FE) MG TABS Take 1 tablet by mouth daily.      Marland Kitchen HYDROcodone-acetaminophen (VICODIN) 5-500 MG per tablet Take 1 tablet by mouth every 6 (six) hours as needed. pain      . insulin aspart (NOVOLOG) 100 UNIT/ML injection 3x a day (just before each meal) 15-15-25 units.      . insulin detemir (LEVEMIR) 100 UNIT/ML injection Inject 35 Units into the skin at bedtime.       . lansoprazole (PREVACID) 30 MG capsule Take 30 mg by mouth daily.      Marland Kitchen lisinopril  (PRINIVIL,ZESTRIL) 5 MG tablet Take 5 mg by mouth daily.      . Multiple Vitamins-Minerals (MULTIVITAMIN WITH MINERALS) tablet Take 1 tablet by mouth daily.      . Omega-3 Fatty Acids (FISH OIL) 1000 MG CPDR Take 1 capsule by mouth 2 (two) times daily.      . pravastatin (PRAVACHOL) 40 MG tablet Take 40 mg by mouth 2 (two) times daily.      . propranolol (INDERAL) 40 MG tablet Take 1 tablet (40 mg total) by mouth 2 (two) times daily.  60 tablet  0  . sucralfate (CARAFATE) 1 G tablet Take 1 g by mouth 4 (four) times daily. Before meals and at bedtime      . traMADol (ULTRAM) 50 MG tablet Take 50 mg by mouth as needed.        Allergies  Allergen Reactions  . Codeine     Chest pain  . Morphine And Related     Chest pain     Family History  Problem Relation Age of Onset  . Cancer Father  Prostate ancer  . Cancer Other     Breast Cancer-Parent  . Hypertension Other     Parent    BP 122/82  Pulse 65  Temp 97.6 F (36.4 C) (Oral)  Ht 5\' 2"  (1.575 m)  Wt 183 lb (83.008 kg)  BMI 33.47 kg/m2  SpO2 98%   Review of Systems Denies LOC    Objective:   Physical Exam Pulses: dorsalis pedis intact bilat.   Feet: no deformity.  no ulcer on the feet.  feet are of normal color and temp.  Trace bilat leg edema Neuro: sensation is intact to touch on the feet     Assessment & Plan:  DM.  Based on the pattern of her cbg's, she needs some adjustment in her therapy

## 2011-12-15 NOTE — Patient Instructions (Addendum)
reduce the levemir to 35 units at bedtime only. increase the novolog to 3x a day (just before each meal) 15-15-25 units.  Please come back for a follow-up appointment in 2 months.   check your blood sugar 2 times a day.  vary the time of day when you check, between before the 3 meals, and at bedtime.  also check if you have symptoms of your blood sugar being too high or too low.  please keep a record of the readings and bring it to your next appointment here.  please call us sooner if your blood sugar goes below 70, or if it stays over 200.

## 2012-02-17 ENCOUNTER — Encounter: Payer: Self-pay | Admitting: Endocrinology

## 2012-02-17 ENCOUNTER — Ambulatory Visit (INDEPENDENT_AMBULATORY_CARE_PROVIDER_SITE_OTHER): Payer: Medicare Other | Admitting: Endocrinology

## 2012-02-17 VITALS — BP 132/82 | HR 62 | Temp 98.0°F | Resp 16 | Wt 186.1 lb

## 2012-02-17 DIAGNOSIS — E1029 Type 1 diabetes mellitus with other diabetic kidney complication: Secondary | ICD-10-CM

## 2012-02-17 DIAGNOSIS — E1065 Type 1 diabetes mellitus with hyperglycemia: Secondary | ICD-10-CM

## 2012-02-17 NOTE — Patient Instructions (Addendum)
Please increase the levemir to 40 units at bedtime only. reduce the novolog to 3x a day (just before each meal) 15-10-15 units.  Please come back for a follow-up appointment in 4 months.   check your blood sugar 2 times a day.  vary the time of day when you check, between before the 3 meals, and at bedtime.  also check if you have symptoms of your blood sugar being too high or too low.  please keep a record of the readings and bring it to your next appointment here.  please call us sooner if your blood sugar goes below 70, or if it stays over 200.

## 2012-02-17 NOTE — Progress Notes (Signed)
Subjective:    Patient ID: Mary Norris, female    DOB: [birth year], 66 y.o.   MRN: [id number]  HPI pt returns for f/u of IDDM (dx'ed 1997; complicated by renal insufficiency).  no cbg record, but states cbg's vary from 110-180.  It is in general lowest in the afternoon, and highest in am (higher than at hs--she sometimes eats at hs).   Past Medical History  Diagnosis Date  . Diabetes mellitus   . Hypertension   . Headache     migraines  . Arthritis   . Chicken pox   . Depression   . Allergy   . Hyperlipidemia   . History of colon polyps     Past Surgical History  Procedure Date  . Abdominal hysterectomy   . Cholecystectomy   . Tubal ligation   . Appendectomy 1963  . Breast biopsy 1999    History   Social History  . Marital Status: Married    Spouse Name: N/A    Number of Children: 2  . Years of Education: 12   Occupational History  . Retired    Social History Main Topics  . Smoking status: Never Smoker   . Smokeless tobacco: Never Used  . Alcohol Use: No  . Drug Use: No  . Sexually Active: No   Other Topics Concern  . Not on file   Social History Narrative  . No narrative on file    Current Outpatient Prescriptions on File Prior to Visit  Medication Sig Dispense Refill  . amitriptyline (ELAVIL) 50 MG tablet Take 50 mg by mouth at bedtime. sleep      . citalopram (CELEXA) 20 MG tablet Take 1 tablet (20 mg total) by mouth daily.  30 tablet  0  . Ferrous Sulfate (IRON) 325 (65 FE) MG TABS Take 1 tablet by mouth daily.      Marland Kitchen HYDROcodone-acetaminophen (VICODIN) 5-500 MG per tablet Take 1 tablet by mouth every 6 (six) hours as needed. pain      . insulin aspart (NOVOLOG) 100 UNIT/ML injection 3x a day (just before each meal) 15-10-15 units.      . insulin detemir (LEVEMIR) 100 UNIT/ML injection Inject 40 Units into the skin at bedtime.       . lansoprazole (PREVACID) 30 MG capsule Take 30 mg by mouth daily.      Marland Kitchen lisinopril (PRINIVIL,ZESTRIL) 5 MG  tablet Take 5 mg by mouth daily.      . Multiple Vitamins-Minerals (MULTIVITAMIN WITH MINERALS) tablet Take 1 tablet by mouth daily.      . Omega-3 Fatty Acids (FISH OIL) 1000 MG CPDR Take 1 capsule by mouth 2 (two) times daily.      . pravastatin (PRAVACHOL) 40 MG tablet Take 40 mg by mouth 2 (two) times daily.      . propranolol (INDERAL) 40 MG tablet Take 1 tablet (40 mg total) by mouth 2 (two) times daily.  60 tablet  0  . sucralfate (CARAFATE) 1 G tablet Take 1 g by mouth 4 (four) times daily. Before meals and at bedtime      . traMADol (ULTRAM) 50 MG tablet Take 50 mg by mouth as needed.        Allergies  Allergen Reactions  . Codeine     Chest pain  . Morphine And Related     Chest pain     Family History  Problem Relation Age of Onset  . Cancer Father     Prostate ancer  .  Cancer Other     Breast Cancer-Parent  . Hypertension Other     Parent    BP 132/82  Pulse 62  Temp 98 F (36.7 C) (Oral)  Resp 16  Wt 186 lb 2 oz (84.426 kg)  SpO2 97%   Review of Systems denies hypoglycemia    Objective:   Physical Exam VITAL SIGNS:  See vs page GENERAL: no distress SKIN:  Insulin injection sites at the anterior abdomen are normal   (pt says her recent a1c was 7, in [place])    Assessment & Plan:  IDDM, apparently well-controlled

## 2012-06-12 ENCOUNTER — Other Ambulatory Visit: Payer: Self-pay

## 2012-06-21 ENCOUNTER — Ambulatory Visit: Payer: Medicare Other | Admitting: Endocrinology

## 2012-07-09 ENCOUNTER — Ambulatory Visit: Payer: Medicare Other | Admitting: Endocrinology

## 2012-07-13 ENCOUNTER — Ambulatory Visit: Payer: Medicare Other | Admitting: Endocrinology

## 2012-07-21 ENCOUNTER — Ambulatory Visit (INDEPENDENT_AMBULATORY_CARE_PROVIDER_SITE_OTHER): Payer: Medicare Other | Admitting: Endocrinology

## 2012-07-21 ENCOUNTER — Encounter: Payer: Self-pay | Admitting: Endocrinology

## 2012-07-21 VITALS — BP 134/82 | HR 69 | Wt 186.0 lb

## 2012-07-21 DIAGNOSIS — E1065 Type 1 diabetes mellitus with hyperglycemia: Secondary | ICD-10-CM

## 2012-07-21 NOTE — Patient Instructions (Addendum)
Please continue the levemir, 40 units at bedtime only. Please reduce the novolog to 3x a day (just before each meal) 02-04-14 units.  Please come back for a follow-up appointment in 4 months.   check your blood sugar 2 times a day.  vary the time of day when you check, between before the 3 meals, and at bedtime.  also check if you have symptoms of your blood sugar being too high or too low.  please keep a record of the readings and bring it to your next appointment here.  please call us sooner if your blood sugar goes below 70, or if it stays over 200.

## 2012-07-21 NOTE — Progress Notes (Signed)
Subjective:    Patient ID: Mary Norris, female    DOB: February 19, 1946, 67 y.o.   MRN: [id number]  HPI pt returns for f/u of IDDM (dx'ed 1997; complicated by renal insufficiency; she did not tolerate victoza (n/v); she has never had severe hypoglycemia or DKA).  no cbg record, but states cbg's are seldom low.  Hypoglycemia is most common before lunch.  cbg is highest at hs and in am.   Past Medical History  Diagnosis Date  . Diabetes mellitus   . Hypertension   . Headache     migraines  . Arthritis   . Chicken pox   . Depression   . Allergy   . Hyperlipidemia   . History of colon polyps     Past Surgical History  Procedure Laterality Date  . Abdominal hysterectomy    . Cholecystectomy    . Tubal ligation    . Appendectomy  1963  . Breast biopsy  1999    History   Social History  . Marital Status: Married    Spouse Name: N/A    Number of Children: 2  . Years of Education: 12   Occupational History  . Retired    Social History Main Topics  . Smoking status: Never Smoker   . Smokeless tobacco: Never Used  . Alcohol Use: No  . Drug Use: No  . Sexually Active: No   Other Topics Concern  . Not on file   Social History Narrative  . No narrative on file    Current Outpatient Prescriptions on File Prior to Visit  Medication Sig Dispense Refill  . amitriptyline (ELAVIL) 50 MG tablet Take 50 mg by mouth at bedtime. sleep      . Ferrous Sulfate (IRON) 325 (65 FE) MG TABS Take 1 tablet by mouth daily.      Marland Kitchen HYDROcodone-acetaminophen (VICODIN) 5-500 MG per tablet Take 1 tablet by mouth every 6 (six) hours as needed. pain      . insulin aspart (NOVOLOG) 100 UNIT/ML injection 3x a day (just before each meal) 02-04-14 units.      . insulin detemir (LEVEMIR) 100 UNIT/ML injection Inject 40 Units into the skin at bedtime.       . lansoprazole (PREVACID) 30 MG capsule Take 30 mg by mouth daily.      Marland Kitchen lisinopril (PRINIVIL,ZESTRIL) 5 MG tablet Take 5 mg by mouth daily.       . Multiple Vitamins-Minerals (MULTIVITAMIN WITH MINERALS) tablet Take 1 tablet by mouth daily.      . Omega-3 Fatty Acids (FISH OIL) 1000 MG CPDR Take 1 capsule by mouth 2 (two) times daily.      . pravastatin (PRAVACHOL) 40 MG tablet Take 40 mg by mouth 2 (two) times daily.      . propranolol (INDERAL) 40 MG tablet Take 1 tablet (40 mg total) by mouth 2 (two) times daily.  60 tablet  0  . sucralfate (CARAFATE) 1 G tablet Take 1 g by mouth 4 (four) times daily. Before meals and at bedtime      . traMADol (ULTRAM) 50 MG tablet Take 50 mg by mouth as needed.       No current facility-administered medications on file prior to visit.    Allergies  Allergen Reactions  . Codeine     Chest pain  . Morphine And Related     Chest pain     Family History  Problem Relation Age of Onset  . Cancer Father  Prostate ancer  . Cancer Other     Breast Cancer-Parent  . Hypertension Other     Parent    BP 134/82  Pulse 69  Wt 186 lb (84.369 kg)  BMI 34.01 kg/m2  SpO2 96%   Review of Systems Denies LOC    Objective:   Physical Exam VITAL SIGNS:  See vs page GENERAL: no distress Pulses: dorsalis pedis intact bilat.   Feet: no deformity.  no ulcer on the feet.  feet are of normal color and temp.  no edema Neuro: sensation is intact to touch on the feet.     outside test results are reviewed: A1c=7.8    Assessment & Plan:  DM: next step is to minimize or avoid hypoglycemia

## 2012-12-01 ENCOUNTER — Other Ambulatory Visit: Payer: Self-pay

## 2012-12-06 ENCOUNTER — Encounter: Payer: Self-pay | Admitting: Endocrinology

## 2012-12-06 ENCOUNTER — Ambulatory Visit (INDEPENDENT_AMBULATORY_CARE_PROVIDER_SITE_OTHER): Payer: Medicare Other | Admitting: Endocrinology

## 2012-12-06 VITALS — BP 134/78 | HR 76 | Ht 62.0 in | Wt 191.0 lb

## 2012-12-06 DIAGNOSIS — M545 Low back pain, unspecified: Secondary | ICD-10-CM

## 2012-12-06 DIAGNOSIS — E1029 Type 1 diabetes mellitus with other diabetic kidney complication: Secondary | ICD-10-CM

## 2012-12-06 DIAGNOSIS — E1065 Type 1 diabetes mellitus with hyperglycemia: Secondary | ICD-10-CM

## 2012-12-06 HISTORY — DX: Low back pain, unspecified: M54.50

## 2012-12-06 LAB — MICROALBUMIN / CREATININE URINE RATIO
Creatinine,U: 119.6 mg/dL
Microalb Creat Ratio: 16.6 mg/g (ref 0.0–30.0)

## 2012-12-06 NOTE — Progress Notes (Signed)
Subjective:    Patient ID: Mary Norris, female    DOB: July 15, 1945, 67 y.o.   MRN: [id number]  HPI pt returns for f/u of IDDM (dx'ed 1997; she has mild if any neuropathy of the lower extremities, but she has associated renal insufficiency; she did not tolerate victoza (n/v); she has never had severe hypoglycemia or DKA; she was rx'ed by paramedics for severe hypoglycemia on 08/21/12; she is unable to cite any precip factor for this episode, which happened in the afternoon; at the time, she was in bed taking medication for back pain).  she brings a record of her cbg's which i have reviewed today. It varies from 70-200.  There is no trend throughout the day.   Past Medical History  Diagnosis Date  . Diabetes mellitus   . Hypertension   . Headache(784.0)     migraines  . Arthritis   . Chicken pox   . Depression   . Allergy   . Hyperlipidemia   . History of colon polyps     Past Surgical History  Procedure Laterality Date  . Abdominal hysterectomy    . Cholecystectomy    . Tubal ligation    . Appendectomy  1963  . Breast biopsy  1999    History   Social History  . Marital Status: Married    Spouse Name: N/A    Number of Children: 2  . Years of Education: 12   Occupational History  . Retired    Social History Main Topics  . Smoking status: Never Smoker   . Smokeless tobacco: Never Used  . Alcohol Use: No  . Drug Use: No  . Sexual Activity: No   Other Topics Concern  . Not on file   Social History Narrative  . No narrative on file    Current Outpatient Prescriptions on File Prior to Visit  Medication Sig Dispense Refill  . amitriptyline (ELAVIL) 50 MG tablet Take 50 mg by mouth at bedtime. sleep      . Ferrous Sulfate (IRON) 325 (65 FE) MG TABS Take 1 tablet by mouth daily.      . hydrochlorothiazide (HYDRODIURIL) 25 MG tablet Take 25 mg by mouth daily.      Marland Kitchen HYDROcodone-acetaminophen (VICODIN) 5-500 MG per tablet Take 1 tablet by mouth every 6 (six) hours  as needed. pain      . insulin detemir (LEVEMIR) 100 UNIT/ML injection Inject 40 Units into the skin at bedtime.       . lansoprazole (PREVACID) 30 MG capsule Take 30 mg by mouth daily.      Marland Kitchen lisinopril (PRINIVIL,ZESTRIL) 5 MG tablet Take 5 mg by mouth daily.      . Multiple Vitamins-Minerals (MULTIVITAMIN WITH MINERALS) tablet Take 1 tablet by mouth daily.      . Omega-3 Fatty Acids (FISH OIL) 1000 MG CPDR Take 1 capsule by mouth 2 (two) times daily.      . pravastatin (PRAVACHOL) 40 MG tablet Take 40 mg by mouth 2 (two) times daily.      . propranolol (INDERAL) 40 MG tablet Take 1 tablet (40 mg total) by mouth 2 (two) times daily.  60 tablet  0  . sucralfate (CARAFATE) 1 G tablet Take 1 g by mouth 4 (four) times daily. Before meals and at bedtime      . traMADol (ULTRAM) 50 MG tablet Take 50 mg by mouth as needed.      . insulin aspart (NOVOLOG) 100 UNIT/ML injection 3x a  day (just before each meal) 02-04-14 units.       No current facility-administered medications on file prior to visit.    Allergies  Allergen Reactions  . Codeine     Chest pain  . Morphine And Related     Chest pain     Family History  Problem Relation Age of Onset  . Cancer Father     Prostate ancer  . Cancer Other     Breast Cancer-Parent  . Hypertension Other     Parent   BP 134/78  Pulse 76  Ht 5\' 2"  (1.575 m)  Wt 191 lb (86.637 kg)  BMI 34.93 kg/m2  SpO2 98%  Review of Systems She has gained a few lbs.  She denies n/v.      Objective:   Physical Exam VITAL SIGNS:  See vs page.   GENERAL: no distress.  Lab Results  Component Value Date   HGBA1C 7.9* 12/06/2012      Assessment & Plan:  DM: This insulin regimen was chosen from multiple options, as it best matches his insulin to his changing requirements throughout the day.  However, she may need a simpler regimen. The benefits of glycemic control must be weighed against the risks of hypoglycemia.  Because a1c is in the 7's, we'll continue  the same rx for now.

## 2012-12-06 NOTE — Patient Instructions (Addendum)
blood tests are being requested for you today.  We'll contact you with results. Based on the results, we may need to change to the levemir only.  i'll let you know.   Please come back for a follow-up appointment in 4 months.   check your blood sugar 2 times a day.  vary the time of day when you check, between before the 3 meals, and at bedtime.  also check if you have symptoms of your blood sugar being too high or too low.  please keep a record of the readings and bring it to your next appointment here.  please call us sooner if your blood sugar goes below 70, or if it stays over 200.

## 2013-03-03 ENCOUNTER — Other Ambulatory Visit: Payer: Self-pay

## 2013-04-04 ENCOUNTER — Encounter: Payer: Self-pay | Admitting: Endocrinology

## 2013-04-04 ENCOUNTER — Ambulatory Visit (INDEPENDENT_AMBULATORY_CARE_PROVIDER_SITE_OTHER): Payer: Medicare Other | Admitting: Endocrinology

## 2013-04-04 VITALS — BP 126/82 | HR 78 | Temp 97.7°F | Ht 62.0 in | Wt 195.0 lb

## 2013-04-04 DIAGNOSIS — E1029 Type 1 diabetes mellitus with other diabetic kidney complication: Secondary | ICD-10-CM

## 2013-04-04 NOTE — Progress Notes (Signed)
Subjective:    Patient ID: Mary Norris, female    DOB: May 18, 1945, 67 y.o.   MRN: [id number]  HPI pt returns for f/u of IDDM (dx'ed 1997, when she presented with blurry vision (glucose was 500); she has mild if any neuropathy of the lower extremities, but she has associated renal insufficiency; she did not tolerate victoza (n/v); she has been on insulin since 2010; she has never had severe hypoglycemia or DKA; she was rx'ed by paramedics for severe hypoglycemia on 08/21/12; she is unable to cite any precip factor for this episode, which happened in the afternoon; at the time, she was in bed taking medication for back pain).  she brings a record of her cbg's which i have reviewed today.  It varies from 63-250.  There is no trend throughout the day. Past Medical History  Diagnosis Date  . Diabetes mellitus   . Hypertension   . Headache(784.0)     migraines  . Arthritis   . Chicken pox   . Depression   . Allergy   . Hyperlipidemia   . History of colon polyps     Past Surgical History  Procedure Laterality Date  . Abdominal hysterectomy    . Cholecystectomy    . Tubal ligation    . Appendectomy  1963  . Breast biopsy  1999    History   Social History  . Marital Status: Married    Spouse Name: N/A    Number of Children: 2  . Years of Education: 12   Occupational History  . Retired    Social History Main Topics  . Smoking status: Never Smoker   . Smokeless tobacco: Never Used  . Alcohol Use: No  . Drug Use: No  . Sexual Activity: No   Other Topics Concern  . Not on file   Social History Narrative  . No narrative on file    Current Outpatient Prescriptions on File Prior to Visit  Medication Sig Dispense Refill  . amitriptyline (ELAVIL) 50 MG tablet Take 50 mg by mouth at bedtime. sleep      . Ferrous Sulfate (IRON) 325 (65 FE) MG TABS Take 1 tablet by mouth daily.      . hydrochlorothiazide (HYDRODIURIL) 25 MG tablet Take 25 mg by mouth daily.      Marland Kitchen  HYDROcodone-acetaminophen (VICODIN) 5-500 MG per tablet Take 1 tablet by mouth every 6 (six) hours as needed. pain      . insulin detemir (LEVEMIR) 100 UNIT/ML injection Inject 40 Units into the skin at bedtime.       . lansoprazole (PREVACID) 30 MG capsule Take 30 mg by mouth daily.      Marland Kitchen lisinopril (PRINIVIL,ZESTRIL) 5 MG tablet Take 5 mg by mouth daily.      . Multiple Vitamins-Minerals (MULTIVITAMIN WITH MINERALS) tablet Take 1 tablet by mouth daily.      . Omega-3 Fatty Acids (FISH OIL) 1000 MG CPDR Take 1 capsule by mouth 2 (two) times daily.      . pravastatin (PRAVACHOL) 40 MG tablet Take 40 mg by mouth 2 (two) times daily.      . propranolol (INDERAL) 40 MG tablet Take 1 tablet (40 mg total) by mouth 2 (two) times daily.  60 tablet  0  . sucralfate (CARAFATE) 1 G tablet Take 1 g by mouth 4 (four) times daily. Before meals and at bedtime      . traMADol (ULTRAM) 50 MG tablet Take 50 mg by mouth  as needed.      . insulin aspart (NOVOLOG) 100 UNIT/ML injection 3x a day (just before each meal) 02-04-14 units.       No current facility-administered medications on file prior to visit.   Allergies  Allergen Reactions  . Codeine     Chest pain  . Morphine And Related     Chest pain    Family History  Problem Relation Age of Onset  . Cancer Father     Prostate ancer  . Cancer Other     Breast Cancer-Parent  . Hypertension Other     Parent    BP 126/82  Pulse 78  Temp(Src) 97.7 F (36.5 C) (Oral)  Ht 5\' 2"  (1.575 m)  Wt 195 lb (88.451 kg)  BMI 35.66 kg/m2  SpO2 96%  Review of Systems Denies LOC.  She has weight gain.      Objective:   Physical Exam VITAL SIGNS:  See vs page GENERAL: no distress  Lab Results  Component Value Date   HGBA1C 8.3* 04/04/2013      Assessment & Plan:  DM: This insulin regimen was chosen from multiple options, as it best matches his insulin to his changing requirements throughout the day.  However, she may need a simpler regimen. The  benefits of glycemic control must be weighed against the risks of hypoglycemia.  He needs a simpler regimen. Weight gain: this complicates the rx of DM.

## 2013-04-04 NOTE — Patient Instructions (Addendum)
blood tests are being requested for you today.  We'll contact you with results. Based on the results, we still may need to change to the levemir only.  i'll let you know.   Please come back for a follow-up appointment in 4 months.   check your blood sugar 2 times a day.  vary the time of day when you check, between before the 3 meals, and at bedtime.  also check if you have symptoms of your blood sugar being too high or too low.  please keep a record of the readings and bring it to your next appointment here.  please call us sooner if your blood sugar goes below 70, or if it stays over 200.

## 2013-04-25 ENCOUNTER — Other Ambulatory Visit: Payer: Self-pay

## 2013-04-25 MED ORDER — INSULIN DETEMIR 100 UNIT/ML ~~LOC~~ SOLN: 40.0000 [IU] | Freq: Every day | SUBCUTANEOUS | Status: DC

## 2013-07-22 IMAGING — CR DG CHEST 2V
2 series · 2 of 2 positions shown · non-contrast
Comparison: None.

CLINICAL DATA: Cough.  Hypotension and bradycardia.

CHEST - 2 VIEW

[w chest lat]
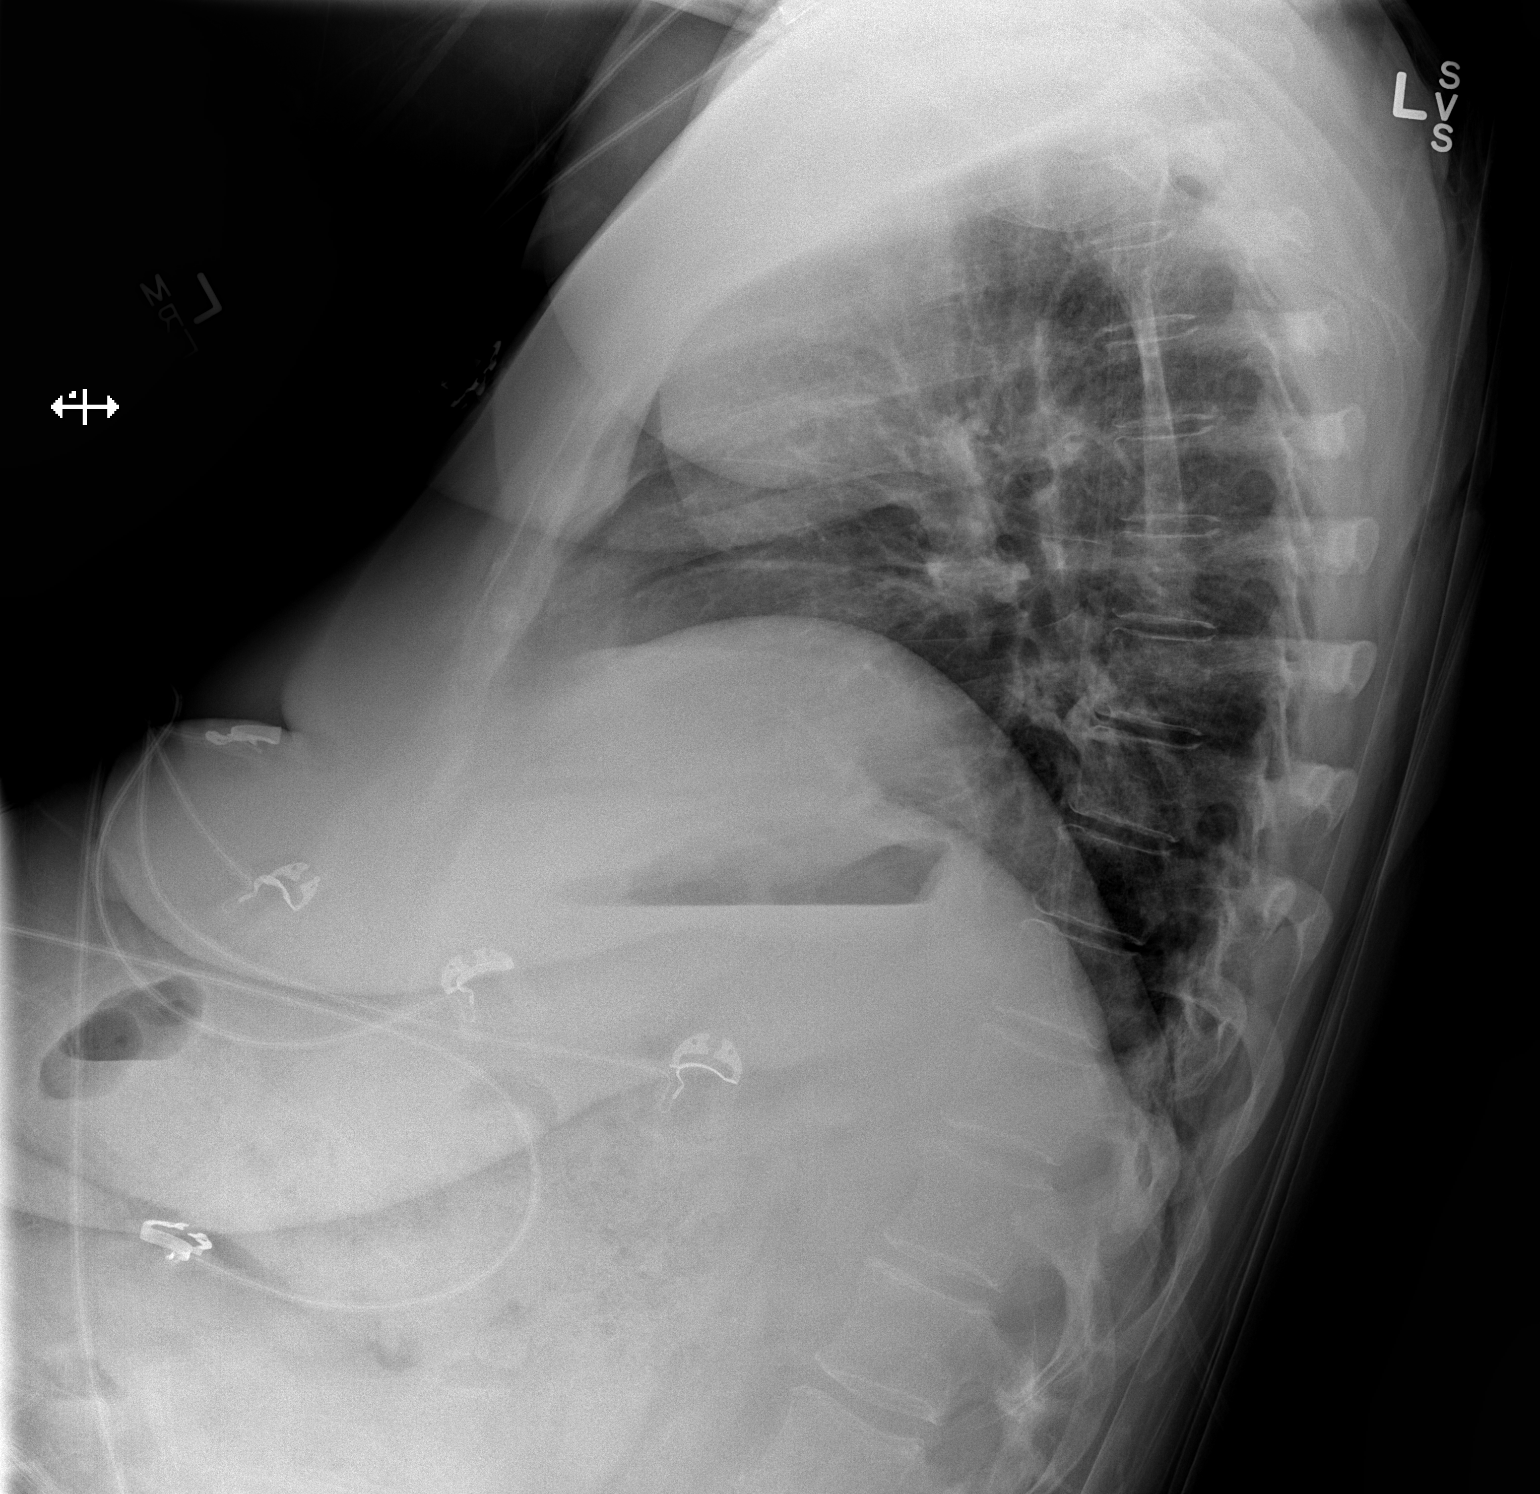

[x chest ap]
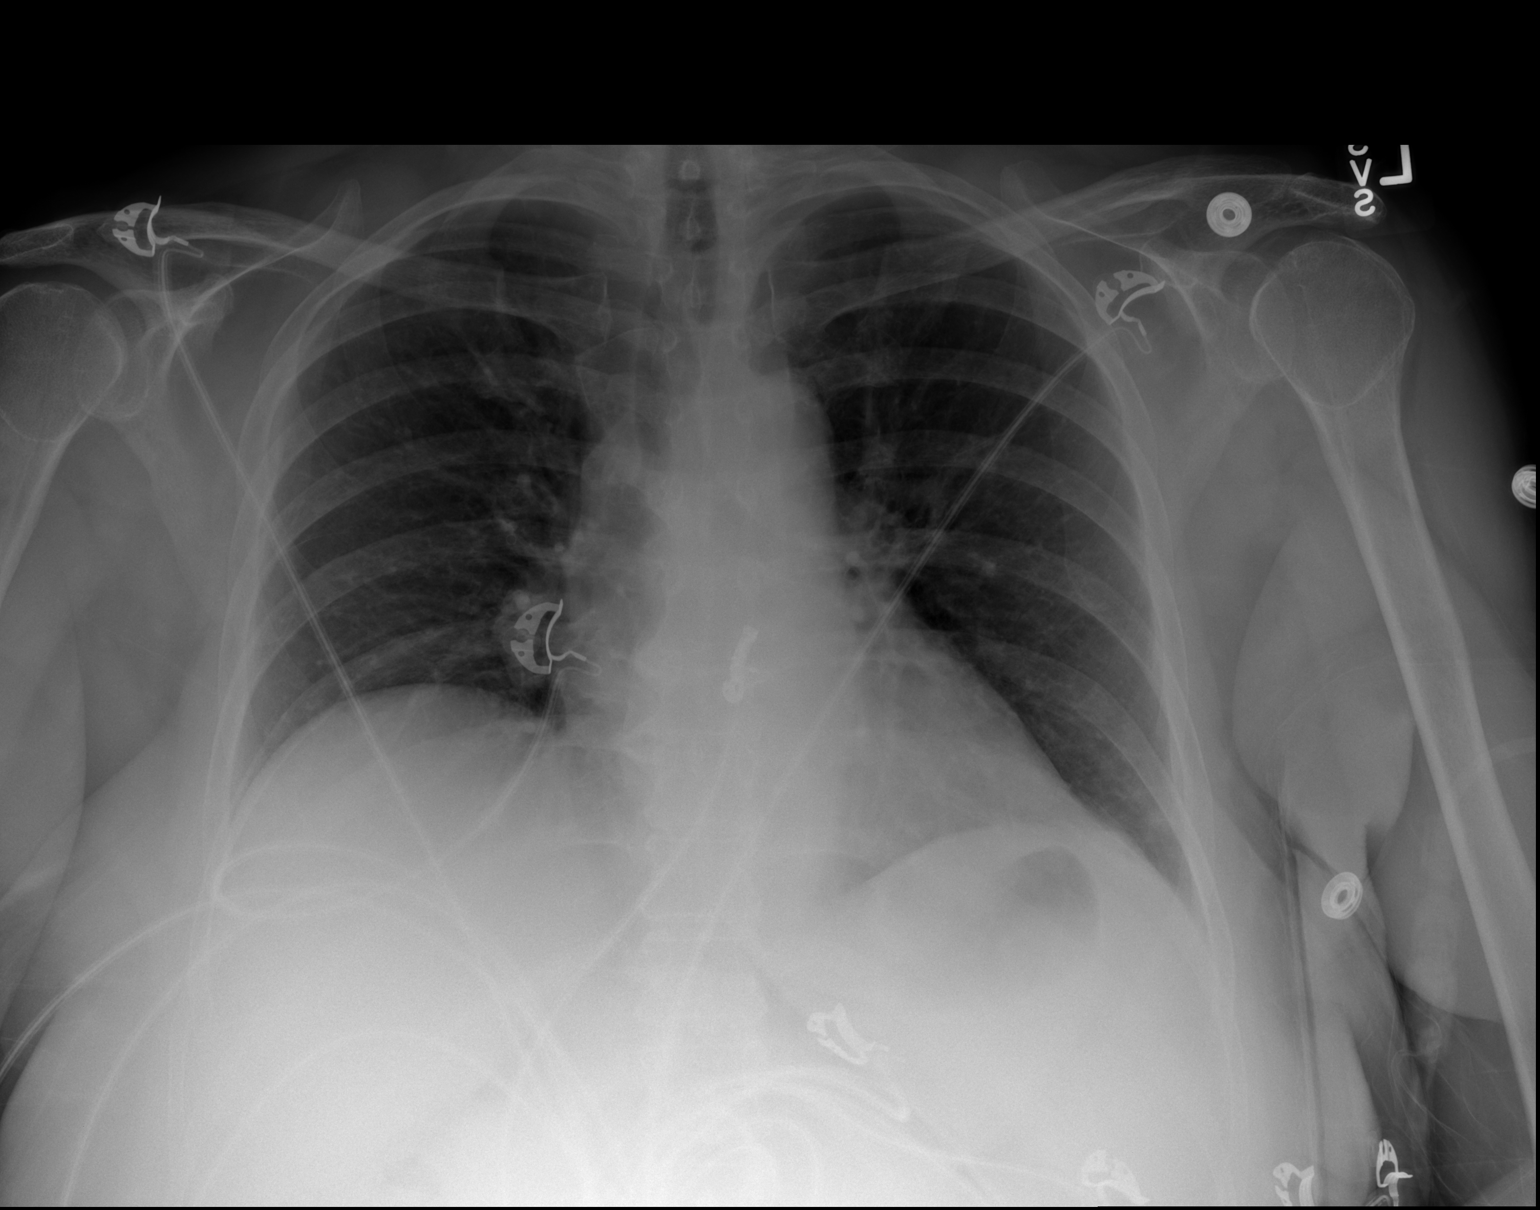

[2 of 2 positions shown; findings below may reference images not displayed]

FINDINGS: Trachea is midline.  Heart size normal.  Lungs are low in
volume but clear.  No pleural fluid.
IMPRESSION: No acute findings.

## 2013-08-18 ENCOUNTER — Telehealth: Payer: Self-pay | Admitting: Endocrinology

## 2013-08-18 NOTE — Telephone Encounter (Signed)
Patient would like to know when she should come back in for another appointment. It does not say at last visit info

## 2013-08-19 NOTE — Telephone Encounter (Signed)
Left message informing pt that she is due for a follow up appointment and to call back an reschedule.

## 2013-10-25 ENCOUNTER — Encounter: Payer: Self-pay | Admitting: Endocrinology

## 2013-10-25 ENCOUNTER — Ambulatory Visit (INDEPENDENT_AMBULATORY_CARE_PROVIDER_SITE_OTHER): Payer: Medicare Other | Admitting: Endocrinology

## 2013-10-25 VITALS — BP 116/78 | HR 66 | Temp 98.1°F | Ht 62.0 in | Wt 197.0 lb

## 2013-10-25 DIAGNOSIS — E1065 Type 1 diabetes mellitus with hyperglycemia: Principal | ICD-10-CM

## 2013-10-25 DIAGNOSIS — E1029 Type 1 diabetes mellitus with other diabetic kidney complication: Secondary | ICD-10-CM

## 2013-10-25 LAB — HEMOGLOBIN A1C: HEMOGLOBIN A1C: 7.6 % — AB (ref 4.6–6.5)

## 2013-10-25 MED ORDER — INSULIN GLARGINE 100 UNIT/ML SOLOSTAR PEN: 50.0000 [IU] | PEN_INJECTOR | SUBCUTANEOUS | Status: DC

## 2013-10-25 NOTE — Progress Notes (Signed)
Subjective:    Patient ID: Mary Norris, female    DOB: 03-06-1946, 68 y.o.   MRN: [id number]  HPI pt returns for f/u of IDDM (dx'ed 1997, when she presented with blurry vision (glucose was 500); she has mild if any neuropathy of the lower extremities, but she has associated renal insufficiency; she did not tolerate victoza (n/v); she has been on insulin since 2010; she has never had pancreatitis or DKA; she had severe hypoglycemia in early 2014 and again in early 2015; she is unable to cite any precip factor for these episodes, both of which happened in the afternoon).  she brings a record of her cbg's which i have reviewed today.  It varies from 70-200's.  There is no trend throughout the day.  She is unable to cite precip factor for the episode of severe hypoglycemia.  She takes a widely varying dosage of both insulins.   Past Medical History  Diagnosis Date  . Diabetes mellitus   . Hypertension   . Headache(784.0)     migraines  . Arthritis   . Chicken pox   . Depression   . Allergy   . Hyperlipidemia   . History of colon polyps     Past Surgical History  Procedure Laterality Date  . Abdominal hysterectomy    . Cholecystectomy    . Tubal ligation    . Appendectomy  1963  . Breast biopsy  1999    History   Social History  . Marital Status: Married    Spouse Name: N/A    Number of Children: 2  . Years of Education: 12   Occupational History  . Retired    Social History Main Topics  . Smoking status: Never Smoker   . Smokeless tobacco: Never Used  . Alcohol Use: No  . Drug Use: No  . Sexual Activity: No   Other Topics Concern  . Not on file   Social History Narrative  . No narrative on file    Current Outpatient Prescriptions on File Prior to Visit  Medication Sig Dispense Refill  . amitriptyline (ELAVIL) 50 MG tablet Take 50 mg by mouth at bedtime. sleep      . Ferrous Sulfate (IRON) 325 (65 FE) MG TABS Take 1 tablet by mouth daily.      .  hydrochlorothiazide (HYDRODIURIL) 25 MG tablet Take 25 mg by mouth daily.      Marland Kitchen. HYDROcodone-acetaminophen (VICODIN) 5-500 MG per tablet Take 1 tablet by mouth every 6 (six) hours as needed. pain      . lansoprazole (PREVACID) 30 MG capsule Take 30 mg by mouth daily.      Marland Kitchen. lisinopril (PRINIVIL,ZESTRIL) 5 MG tablet Take 5 mg by mouth daily.      . Multiple Vitamins-Minerals (MULTIVITAMIN WITH MINERALS) tablet Take 1 tablet by mouth daily.      . Omega-3 Fatty Acids (FISH OIL) 1000 MG CPDR Take 1 capsule by mouth 2 (two) times daily.      . pravastatin (PRAVACHOL) 40 MG tablet Take 40 mg by mouth 2 (two) times daily.      . propranolol (INDERAL) 40 MG tablet Take 1 tablet (40 mg total) by mouth 2 (two) times daily.  60 tablet  0  . sucralfate (CARAFATE) 1 G tablet Take 1 g by mouth 4 (four) times daily. Before meals and at bedtime      . traMADol (ULTRAM) 50 MG tablet Take 50 mg by mouth as needed.      .Marland Kitchen  insulin aspart (NOVOLOG) 100 UNIT/ML injection 3x a day (just before each meal) 02-04-14 units.       No current facility-administered medications on file prior to visit.    Allergies  Allergen Reactions  . Codeine     Chest pain  . Morphine And Related     Chest pain     Family History  Problem Relation Age of Onset  . Cancer Father     Prostate ancer  . Cancer Other     Breast Cancer-Parent  . Hypertension Other     Parent   BP 116/78  Pulse 66  Temp(Src) 98.1 F (36.7 C) (Oral)  Ht 5\' 2"  (1.575 m)  Wt 197 lb (89.359 kg)  BMI 36.02 kg/m2  SpO2 97%  Review of Systems She gained weight, but has since re-lost the weight.  She denies hypoglycemia.    Objective:   Physical Exam VITAL SIGNS:  See vs page GENERAL: no distress Pulses: dorsalis pedis intact bilat.   Feet: no deformity. normal color and temp.  Trace bilat leg edema Skin:  no ulcer on the feet.   Neuro: sensation is intact to touch on the feet.  Lab Results  Component Value Date   HGBA1C 7.6* 10/25/2013        Assessment & Plan:  DM: mild exacerbation.   Noncompliance with insulin dosing, worse: I'll work around this as best I can.  In this setting, she needs a simpler regimen.    Patient is advised the following: Patient Instructions  blood tests are being requested for you today.  We'll contact you with results. Please change both insulins to lantus only, 50 units each morning. Please come back for a follow-up appointment in 2 weeks.   check your blood sugar 2 times a day.  vary the time of day when you check, between before the 3 meals, and at bedtime.  also check if you have symptoms of your blood sugar being too high or too low.  please keep a record of the readings and bring it to your next appointment here.  please call us sooner if your blood sugar goes below 70, or if it stays over 200.

## 2013-10-25 NOTE — Patient Instructions (Addendum)
blood tests are being requested for you today.  We'll contact you with results. Please change both insulins to lantus only, 50 units each morning. Please come back for a follow-up appointment in 2 weeks.   check your blood sugar 2 times a day.  vary the time of day when you check, between before the 3 meals, and at bedtime.  also check if you have symptoms of your blood sugar being too high or too low.  please keep a record of the readings and bring it to your next appointment here.  please call us sooner if your blood sugar goes below 70, or if it stays over 200.

## 2013-11-08 ENCOUNTER — Ambulatory Visit (INDEPENDENT_AMBULATORY_CARE_PROVIDER_SITE_OTHER): Payer: Medicare Other | Admitting: Endocrinology

## 2013-11-08 ENCOUNTER — Encounter: Payer: Self-pay | Admitting: Endocrinology

## 2013-11-08 VITALS — BP 134/94 | HR 71 | Temp 98.2°F | Ht 62.0 in | Wt 193.0 lb

## 2013-11-08 DIAGNOSIS — E1065 Type 1 diabetes mellitus with hyperglycemia: Principal | ICD-10-CM

## 2013-11-08 DIAGNOSIS — E1029 Type 1 diabetes mellitus with other diabetic kidney complication: Secondary | ICD-10-CM

## 2013-11-08 MED ORDER — INSULIN NPH (HUMAN) (ISOPHANE) 100 UNIT/ML ~~LOC~~ SUSP: 50.0000 [IU] | SUBCUTANEOUS | Status: DC

## 2013-11-08 NOTE — Patient Instructions (Addendum)
Please change lantus to NPH, 50 units each morning.  i have sent a prescription to [facility].   Please come back for a follow-up appointment in 2 months.   check your blood sugar 2 times a day.  vary the time of day when you check, between before the 3 meals, and at bedtime.  also check if you have symptoms of your blood sugar being too high or too low.  please keep a record of the readings and bring it to your next appointment here.  please call us sooner if your blood sugar goes below 70, or if it stays over 200.

## 2013-11-08 NOTE — Progress Notes (Signed)
Subjective:    Patient ID: Mary Norris, female    DOB: [birth year], 68 y.o.   MRN: [id number]  HPI pt returns for f/u of IDDM (dx'ed 1997, when she presented with blurry vision (glucose was 500); she has mild if any neuropathy of the lower extremities, but she has associated renal insufficiency; she did not tolerate victoza (n/v); she has been on insulin since 2010; she has never had pancreatitis or DKA; she had severe hypoglycemia in early 2014 and again in early 2015; she is unable to cite any precip factor for these episodes, both of which happened in the afternoon; in mid-2105, she was changed to qd insulin, after suboptimal results with multiple daily injections).  she brings a record of her cbg's which i have reviewed today.  It varies from 99-300, but most are in the 100's.  It is in general higher as the day goes on.  pt states she feels well in general.   Past Medical History  Diagnosis Date  . Diabetes mellitus   . Hypertension   . Headache(784.0)     migraines  . Arthritis   . Chicken pox   . Depression   . Allergy   . Hyperlipidemia   . History of colon polyps     Past Surgical History  Procedure Laterality Date  . Abdominal hysterectomy    . Cholecystectomy    . Tubal ligation    . Appendectomy  1963  . Breast biopsy  1999    History   Social History  . Marital Status: Married    Spouse Name: N/A    Number of Children: 2  . Years of Education: 12   Occupational History  . Retired    Social History Main Topics  . Smoking status: Never Smoker   . Smokeless tobacco: Never Used  . Alcohol Use: No  . Drug Use: No  . Sexual Activity: No   Other Topics Concern  . Not on file   Social History Narrative  . No narrative on file    Current Outpatient Prescriptions on File Prior to Visit  Medication Sig Dispense Refill  . amitriptyline (ELAVIL) 50 MG tablet Take 50 mg by mouth at bedtime. sleep      . Ferrous Sulfate (IRON) 325 (65 FE) MG TABS Take 1  tablet by mouth daily.      . hydrochlorothiazide (HYDRODIURIL) 25 MG tablet Take 25 mg by mouth daily.      Marland Kitchen HYDROcodone-acetaminophen (VICODIN) 5-500 MG per tablet Take 1 tablet by mouth every 6 (six) hours as needed. pain      . lansoprazole (PREVACID) 30 MG capsule Take 30 mg by mouth daily.      Marland Kitchen lisinopril (PRINIVIL,ZESTRIL) 5 MG tablet Take 5 mg by mouth daily.      . Multiple Vitamins-Minerals (MULTIVITAMIN WITH MINERALS) tablet Take 1 tablet by mouth daily.      . Omega-3 Fatty Acids (FISH OIL) 1000 MG CPDR Take 1 capsule by mouth 2 (two) times daily.      . pravastatin (PRAVACHOL) 40 MG tablet Take 40 mg by mouth 2 (two) times daily.      . propranolol (INDERAL) 40 MG tablet Take 1 tablet (40 mg total) by mouth 2 (two) times daily.  60 tablet  0  . sucralfate (CARAFATE) 1 G tablet Take 1 g by mouth 4 (four) times daily. Before meals and at bedtime      . traMADol (ULTRAM) 50 MG tablet Take  50 mg by mouth as needed.       No current facility-administered medications on file prior to visit.    Allergies  Allergen Reactions  . Codeine     Chest pain  . Morphine And Related     Chest pain     Family History  Problem Relation Age of Onset  . Cancer Father     Prostate ancer  . Cancer Other     Breast Cancer-Parent  . Hypertension Other     Parent    BP 134/94  Pulse 71  Temp(Src) 98.2 F (36.8 C) (Oral)  Ht 5\' 2"  (1.575 m)  Wt 193 lb (87.544 kg)  BMI 35.29 kg/m2  SpO2 95%  Review of Systems She denies hypoglycemia.  She has lost a few lbs.      Objective:   Physical Exam VITAL SIGNS:  See vs page GENERAL: no distress SKIN:  Insulin injection sites at the anterior abdomen are normal.    Lab Results  Component Value Date   HGBA1C 7.6* 10/25/2013       Assessment & Plan:  DM: mild exacerbation: Based on the pattern of her cbg's, she needs a faster and shorter-acting insulin. Weight-loss this helps the control of DM.  i encouraged pt to  continue.    Patient is advised the following: Patient Instructions  Please change lantus to NPH, 50 units each morning.  i have sent a prescription to [facility].   Please come back for a follow-up appointment in 2 months.   check your blood sugar 2 times a day.  vary the time of day when you check, between before the 3 meals, and at bedtime.  also check if you have symptoms of your blood sugar being too high or too low.  please keep a record of the readings and bring it to your next appointment here.  please call us sooner if your blood sugar goes below 70, or if it stays over 200.

## 2014-01-09 ENCOUNTER — Ambulatory Visit: Payer: Medicare Other | Admitting: Endocrinology

## 2014-02-13 ENCOUNTER — Encounter: Payer: Self-pay | Admitting: Endocrinology

## 2014-02-13 ENCOUNTER — Ambulatory Visit (INDEPENDENT_AMBULATORY_CARE_PROVIDER_SITE_OTHER): Payer: Medicare Other | Admitting: Endocrinology

## 2014-02-13 VITALS — BP 118/76 | HR 67 | Temp 98.0°F | Ht 62.0 in | Wt 194.0 lb

## 2014-02-13 DIAGNOSIS — E1029 Type 1 diabetes mellitus with other diabetic kidney complication: Secondary | ICD-10-CM

## 2014-02-13 DIAGNOSIS — E1065 Type 1 diabetes mellitus with hyperglycemia: Principal | ICD-10-CM

## 2014-02-13 DIAGNOSIS — IMO0002 Reserved for concepts with insufficient information to code with codable children: Secondary | ICD-10-CM

## 2014-02-13 LAB — HEMOGLOBIN A1C: Hgb A1c MFr Bld: 8.3 % — ABNORMAL HIGH (ref 4.6–6.5)

## 2014-02-13 LAB — LIPID PANEL
CHOL/HDL RATIO: 5
Cholesterol: 148 mg/dL (ref 0–200)
HDL: 28.7 mg/dL — ABNORMAL LOW (ref >39.00)
NONHDL: 119.3
Triglycerides: 347 mg/dL — ABNORMAL HIGH (ref 0.0–149.0)
VLDL: 69.4 mg/dL — ABNORMAL HIGH (ref 0.0–40.0)

## 2014-02-13 LAB — BASIC METABOLIC PANEL
BUN: 21 mg/dL (ref 6–23)
CHLORIDE: 102 meq/L (ref 96–112)
CO2: 25 meq/L (ref 19–32)
CREATININE: 1.4 mg/dL — AB (ref 0.4–1.2)
Calcium: 9.1 mg/dL (ref 8.4–10.5)
GFR: 39.14 mL/min — ABNORMAL LOW (ref >60.00)
Glucose, Bld: 340 mg/dL — ABNORMAL HIGH (ref 70–99)
Potassium: 5.1 mEq/L (ref 3.5–5.1)
Sodium: 138 mEq/L (ref 135–145)

## 2014-02-13 LAB — LDL CHOLESTEROL, DIRECT: Direct LDL: 74.9 mg/dL

## 2014-02-13 LAB — MICROALBUMIN / CREATININE URINE RATIO
Creatinine,U: 321.7 mg/dL
MICROALB UR: 23.1 mg/dL — AB (ref 0.0–1.9)
MICROALB/CREAT RATIO: 7.2 mg/g (ref 0.0–30.0)

## 2014-02-13 LAB — TSH: TSH: 0.47 u[IU]/mL (ref 0.35–4.50)

## 2014-02-13 NOTE — Patient Instructions (Addendum)
Please come back for a follow-up appointment in 3 months.   check your blood sugar 2 times a day.  vary the time of day when you check, between before the 3 meals, and at bedtime.  also check if you have symptoms of your blood sugar being too high or too low.  please keep a record of the readings and bring it to your next appointment here.  please call us sooner if your blood sugar goes below 70, or if it stays over 200.   On this type of insulin schedule, you should eat meals on a regular schedule.  If a meal is missed or significantly delayed, your blood sugar could go low. Please change the NPH insulin to 45 units in the morning, and 5 units in the evening.   blood tests are being requested for you today.  We'll contact you with results.

## 2014-02-13 NOTE — Progress Notes (Signed)
Subjective:    Patient ID: Mary Norris, female    DOB: 11-26-1945, 68 y.o.   MRN: [id number]  HPI Pt returns for f/u of diabetes mellitus: DM type: Insulin-requiring type 2 Dx'ed: 1997, when she presented with glucose of 500 Complications: renal insufficiency Therapy: insulin since 2010 GDM: never DKA: never Severe hypoglycemia: early 2014 and early 2015 Pancreatitis: never Other: she was changed to qd insulin, after suboptimal results with multiple daily injections; she did not tolerate victoza (n/v) Interval history:  She has mild hypoglycemia approx twice a month.  This usually happens in the afternoon.  It is usually highest at HS.  she brings a record of her cbg's which i have reviewed today.  It varies from 58-200's.  pt states she feels well in general. Past Medical History  Diagnosis Date  . Diabetes mellitus   . Hypertension   . Headache(784.0)     migraines  . Arthritis   . Chicken pox   . Depression   . Allergy   . Hyperlipidemia   . History of colon polyps     Past Surgical History  Procedure Laterality Date  . Abdominal hysterectomy    . Cholecystectomy    . Tubal ligation    . Appendectomy  1963  . Breast biopsy  1999    History   Social History  . Marital Status: Married    Spouse Name: N/A    Number of Children: 2  . Years of Education: 12   Occupational History  . Retired    Social History Main Topics  . Smoking status: Never Smoker   . Smokeless tobacco: Never Used  . Alcohol Use: No  . Drug Use: No  . Sexual Activity: No   Other Topics Concern  . Not on file   Social History Narrative  . No narrative on file    Current Outpatient Prescriptions on File Prior to Visit  Medication Sig Dispense Refill  . amitriptyline (ELAVIL) 50 MG tablet Take 50 mg by mouth at bedtime. sleep      . Ferrous Sulfate (IRON) 325 (65 FE) MG TABS Take 1 tablet by mouth daily.      . hydrochlorothiazide (HYDRODIURIL) 25 MG tablet Take 25 mg by  mouth daily.      Marland Kitchen. HYDROcodone-acetaminophen (VICODIN) 5-500 MG per tablet Take 1 tablet by mouth every 6 (six) hours as needed. pain      . lansoprazole (PREVACID) 30 MG capsule Take 30 mg by mouth daily.      Marland Kitchen. lisinopril (PRINIVIL,ZESTRIL) 5 MG tablet Take 5 mg by mouth daily.      . Multiple Vitamins-Minerals (MULTIVITAMIN WITH MINERALS) tablet Take 1 tablet by mouth daily.      . Omega-3 Fatty Acids (FISH OIL) 1000 MG CPDR Take 1 capsule by mouth 2 (two) times daily.      . pravastatin (PRAVACHOL) 40 MG tablet Take 40 mg by mouth 2 (two) times daily.      . propranolol (INDERAL) 40 MG tablet Take 1 tablet (40 mg total) by mouth 2 (two) times daily.  60 tablet  0  . sucralfate (CARAFATE) 1 G tablet Take 1 g by mouth 4 (four) times daily. Before meals and at bedtime      . traMADol (ULTRAM) 50 MG tablet Take 50 mg by mouth as needed.       No current facility-administered medications on file prior to visit.    Allergies  Allergen Reactions  . Codeine  Chest pain  . Morphine And Related     Chest pain     Family History  Problem Relation Age of Onset  . Cancer Father     Prostate ancer  . Cancer Other     Breast Cancer-Parent  . Hypertension Other     Parent    BP 118/76  Pulse 67  Temp(Src) 98 F (36.7 C) (Oral)  Ht 5\' 2"  (1.575 m)  Wt 194 lb (87.998 kg)  BMI 35.47 kg/m2  SpO2 96%   Review of Systems Denies LOC and weight change.      Objective:   Physical Exam VITAL SIGNS:  See vs page GENERAL: no distress Pulses: dorsalis pedis intact bilat.   Feet: no deformity.  no edema Skin:  no ulcer on the feet.  normal color and temp. Neuro: sensation is intact to touch on the feet.    Lab Results  Component Value Date   HGBA1C 8.3* 02/13/2014   Lab Results  Component Value Date   TSH 0.47 02/13/2014   Lab Results  Component Value Date   CREATININE 1.4* 02/13/2014   BUN 21 02/13/2014   NA 138 02/13/2014   K 5.1 02/13/2014   CL 102 02/13/2014   CO2  25 02/13/2014   Lab Results  Component Value Date   CHOL 148 02/13/2014   HDL 28.70* 02/13/2014   LDLDIRECT 74.9 02/13/2014   TRIG 347.0* 02/13/2014   CHOLHDL 5 02/13/2014       Assessment & Plan:  DM: moderate exacerbation renal insuff, stable Dyslipidemia: well-controlled   Patient is advised the following: Patient Instructions  Please come back for a follow-up appointment in 3 months.   check your blood sugar 2 times a day.  vary the time of day when you check, between before the 3 meals, and at bedtime.  also check if you have symptoms of your blood sugar being too high or too low.  please keep a record of the readings and bring it to your next appointment here.  please call us sooner if your blood sugar goes below 70, or if it stays over 200.   On this type of insulin schedule, you should eat meals on a regular schedule.  If a meal is missed or significantly delayed, your blood sugar could go low. Please change the NPH insulin to 45 units in the morning, and 5 units in the evening.   blood tests are being requested for you today.  We'll contact you with results.

## 2014-05-16 ENCOUNTER — Ambulatory Visit: Payer: Medicare Other | Admitting: Endocrinology

## 2014-06-06 DIAGNOSIS — H25811 Combined forms of age-related cataract, right eye: Secondary | ICD-10-CM | POA: Diagnosis not present

## 2014-06-13 HISTORY — PX: CATARACT EXTRACTION, BILATERAL: SHX1313

## 2014-06-14 ENCOUNTER — Ambulatory Visit (INDEPENDENT_AMBULATORY_CARE_PROVIDER_SITE_OTHER): Payer: 59 | Admitting: Endocrinology

## 2014-06-14 ENCOUNTER — Encounter: Payer: Self-pay | Admitting: Endocrinology

## 2014-06-14 VITALS — BP 134/86 | HR 73 | Temp 98.1°F | Ht 62.0 in | Wt 193.0 lb

## 2014-06-14 DIAGNOSIS — IMO0002 Reserved for concepts with insufficient information to code with codable children: Secondary | ICD-10-CM

## 2014-06-14 DIAGNOSIS — E1029 Type 1 diabetes mellitus with other diabetic kidney complication: Secondary | ICD-10-CM

## 2014-06-14 DIAGNOSIS — E1065 Type 1 diabetes mellitus with hyperglycemia: Principal | ICD-10-CM

## 2014-06-14 LAB — HEMOGLOBIN A1C: HEMOGLOBIN A1C: 8.9 % — AB (ref 4.6–6.5)

## 2014-06-14 MED ORDER — INSULIN REGULAR HUMAN 100 UNIT/ML IJ SOLN: 10.0000 [IU] | Freq: Every day | INTRAMUSCULAR | Status: DC

## 2014-06-14 MED ORDER — INSULIN NPH (HUMAN) (ISOPHANE) 100 UNIT/ML ~~LOC~~ SUSP: 45.0000 [IU] | SUBCUTANEOUS | Status: DC

## 2014-06-14 NOTE — Patient Instructions (Addendum)
Please come back for a follow-up appointment in 3 months.   check your blood sugar 2 times a day.  vary the time of day when you check, between before the 3 meals, and at bedtime.  also check if you have symptoms of your blood sugar being too high or too low.  please keep a record of the readings and bring it to your next appointment here.  please call us sooner if your blood sugar goes below 70, or if it stays over 200.   On this type of insulin schedule, you should eat meals on a regular schedule.  If a meal is missed or significantly delayed, your blood sugar could go low. Please take NPH insulin, 45 units in the morning, and regular, 10 units with the the evening meal.   blood tests are being requested for you today.  We'll contact you with results.

## 2014-06-14 NOTE — Progress Notes (Signed)
Subjective:    Patient ID: Mary Norris, female    DOB: [birth year], 69 y.o.   MRN: [id number]  HPI Pt returns for f/u of diabetes mellitus: DM type: Insulin-requiring type 2 Dx'ed: 1997, when she presented with glucose of 500 Complications: renal insufficiency Therapy: insulin since 2010 GDM: never DKA: never Severe hypoglycemia: early 2014 and early 2015 Pancreatitis: never Other: she was changed to qd insulin, after suboptimal results with multiple daily injections; she did not tolerate victoza (n/v); she takes human insulin, due to cost.   Interval history: she brings a record of her cbg's which i have reviewed today.  She checks in am and at hs.  It is aprox 100 in am, and 200's at hs.  It is mildly low only approx once per month.  It is highest at hs.   Past Medical History  Diagnosis Date  . Diabetes mellitus   . Hypertension   . Headache(784.0)     migraines  . Arthritis   . Chicken pox   . Depression   . Allergy   . Hyperlipidemia   . History of colon polyps     Past Surgical History  Procedure Laterality Date  . Abdominal hysterectomy    . Cholecystectomy    . Tubal ligation    . Appendectomy  1963  . Breast biopsy  1999    History   Social History  . Marital Status: Married    Spouse Name: N/A  . Number of Children: 2  . Years of Education: 12   Occupational History  . Retired    Social History Main Topics  . Smoking status: Never Smoker   . Smokeless tobacco: Never Used  . Alcohol Use: No  . Drug Use: No  . Sexual Activity: No   Other Topics Concern  . Not on file   Social History Narrative    Current Outpatient Prescriptions on File Prior to Visit  Medication Sig Dispense Refill  . amitriptyline (ELAVIL) 50 MG tablet Take 50 mg by mouth at bedtime. sleep    . Ferrous Sulfate (IRON) 325 (65 FE) MG TABS Take 1 tablet by mouth daily.    . hydrochlorothiazide (HYDRODIURIL) 25 MG tablet Take 25 mg by mouth daily.    Marland Kitchen  HYDROcodone-acetaminophen (VICODIN) 5-500 MG per tablet Take 1 tablet by mouth every 6 (six) hours as needed. pain    . lansoprazole (PREVACID) 30 MG capsule Take 30 mg by mouth daily.    Marland Kitchen lisinopril (PRINIVIL,ZESTRIL) 5 MG tablet Take 5 mg by mouth daily.    . Multiple Vitamins-Minerals (MULTIVITAMIN WITH MINERALS) tablet Take 1 tablet by mouth daily.    . Omega-3 Fatty Acids (FISH OIL) 1000 MG CPDR Take 1 capsule by mouth 2 (two) times daily.    . pravastatin (PRAVACHOL) 40 MG tablet Take 40 mg by mouth 2 (two) times daily.    . propranolol (INDERAL) 40 MG tablet Take 1 tablet (40 mg total) by mouth 2 (two) times daily. 60 tablet 0  . sucralfate (CARAFATE) 1 G tablet Take 1 g by mouth 4 (four) times daily. Before meals and at bedtime    . traMADol (ULTRAM) 50 MG tablet Take 50 mg by mouth as needed.     No current facility-administered medications on file prior to visit.    Allergies  Allergen Reactions  . Codeine     Chest pain  . Morphine And Related     Chest pain     Family  History  Problem Relation Age of Onset  . Cancer Father     Prostate ancer  . Cancer Other     Breast Cancer-Parent  . Hypertension Other     Parent    BP 134/86 mmHg  Pulse 73  Temp(Src) 98.1 F (36.7 C) (Oral)  Ht 5\' 2"  (1.575 m)  Wt 193 lb (87.544 kg)  BMI 35.29 kg/m2  SpO2 97%  Review of Systems Denies LOC and weight change    Objective:   Physical Exam VITAL SIGNS:  See vs page GENERAL: no distress Pulses: dorsalis pedis intact bilat.   MSK: no deformity of the feet CV: no leg edema Skin:  no ulcer on the feet.  normal color and temp on the feet. Neuro: sensation is intact to touch on the feet.   Ext: There is bilateral onychomycosis of the toenails.  Lab Results  Component Value Date   HGBA1C 8.9* 06/14/2014       Assessment & Plan:  DM: moderate exacerbation.  Based on the pattern of her cbg's, she needs a mealtime insulin with the evening meal. Side-effect of rx:  hypoglycemia: this insulin adjustment will help.   Patient is advised the following: Patient Instructions  Please come back for a follow-up appointment in 3 months.   check your blood sugar 2 times a day.  vary the time of day when you check, between before the 3 meals, and at bedtime.  also check if you have symptoms of your blood sugar being too high or too low.  please keep a record of the readings and bring it to your next appointment here.  please call us sooner if your blood sugar goes below 70, or if it stays over 200.   On this type of insulin schedule, you should eat meals on a regular schedule.  If a meal is missed or significantly delayed, your blood sugar could go low. Please take NPH insulin, 45 units in the morning, and regular, 10 units with the the evening meal.   blood tests are being requested for you today.  We'll contact you with results.

## 2014-06-26 ENCOUNTER — Telehealth: Payer: Self-pay | Admitting: Endocrinology

## 2014-06-26 NOTE — Telephone Encounter (Signed)
Pt sugar is running around 300 every morning please advise on any changes needed

## 2014-06-26 NOTE — Telephone Encounter (Signed)
See note below and please advise, Thanks! 

## 2014-06-26 NOTE — Telephone Encounter (Signed)
Pt advised of note below and voiced understanding.  

## 2014-06-26 NOTE — Telephone Encounter (Signed)
please call patient: Increase NPH to 55 units qam. Please call in 3-4 days to tell us how cbg is doing at different times of day.

## 2014-07-18 DIAGNOSIS — H25812 Combined forms of age-related cataract, left eye: Secondary | ICD-10-CM | POA: Diagnosis not present

## 2014-09-12 ENCOUNTER — Ambulatory Visit: Payer: 59 | Admitting: Endocrinology

## 2014-10-23 ENCOUNTER — Other Ambulatory Visit: Payer: Self-pay

## 2014-12-18 DIAGNOSIS — E1129 Type 2 diabetes mellitus with other diabetic kidney complication: Secondary | ICD-10-CM | POA: Diagnosis not present

## 2014-12-18 DIAGNOSIS — I10 Essential (primary) hypertension: Secondary | ICD-10-CM | POA: Diagnosis not present

## 2014-12-18 DIAGNOSIS — E785 Hyperlipidemia, unspecified: Secondary | ICD-10-CM | POA: Diagnosis not present

## 2014-12-18 DIAGNOSIS — N289 Disorder of kidney and ureter, unspecified: Secondary | ICD-10-CM | POA: Diagnosis not present

## 2014-12-18 DIAGNOSIS — Z139 Encounter for screening, unspecified: Secondary | ICD-10-CM | POA: Diagnosis not present

## 2014-12-18 DIAGNOSIS — F329 Major depressive disorder, single episode, unspecified: Secondary | ICD-10-CM | POA: Diagnosis not present

## 2014-12-18 DIAGNOSIS — Z6836 Body mass index (BMI) 36.0-36.9, adult: Secondary | ICD-10-CM | POA: Diagnosis not present

## 2014-12-18 DIAGNOSIS — D649 Anemia, unspecified: Secondary | ICD-10-CM | POA: Diagnosis not present

## 2015-01-02 DIAGNOSIS — L304 Erythema intertrigo: Secondary | ICD-10-CM | POA: Diagnosis not present

## 2015-01-02 DIAGNOSIS — L820 Inflamed seborrheic keratosis: Secondary | ICD-10-CM | POA: Diagnosis not present

## 2015-01-02 DIAGNOSIS — L853 Xerosis cutis: Secondary | ICD-10-CM | POA: Diagnosis not present

## 2015-02-22 DIAGNOSIS — Z1231 Encounter for screening mammogram for malignant neoplasm of breast: Secondary | ICD-10-CM | POA: Diagnosis not present

## 2015-03-15 DIAGNOSIS — D131 Benign neoplasm of stomach: Secondary | ICD-10-CM | POA: Diagnosis not present

## 2015-03-15 DIAGNOSIS — K253 Acute gastric ulcer without hemorrhage or perforation: Secondary | ICD-10-CM | POA: Diagnosis not present

## 2015-03-27 DIAGNOSIS — Z23 Encounter for immunization: Secondary | ICD-10-CM | POA: Diagnosis not present

## 2015-03-27 DIAGNOSIS — E1122 Type 2 diabetes mellitus with diabetic chronic kidney disease: Secondary | ICD-10-CM | POA: Diagnosis not present

## 2015-03-27 DIAGNOSIS — Z1389 Encounter for screening for other disorder: Secondary | ICD-10-CM | POA: Diagnosis not present

## 2015-03-27 DIAGNOSIS — D649 Anemia, unspecified: Secondary | ICD-10-CM | POA: Diagnosis not present

## 2015-03-27 DIAGNOSIS — Z6836 Body mass index (BMI) 36.0-36.9, adult: Secondary | ICD-10-CM | POA: Diagnosis not present

## 2015-03-27 DIAGNOSIS — E785 Hyperlipidemia, unspecified: Secondary | ICD-10-CM | POA: Diagnosis not present

## 2015-03-27 DIAGNOSIS — I10 Essential (primary) hypertension: Secondary | ICD-10-CM | POA: Diagnosis not present

## 2015-05-21 DIAGNOSIS — E119 Type 2 diabetes mellitus without complications: Secondary | ICD-10-CM | POA: Diagnosis not present

## 2015-07-10 DIAGNOSIS — F329 Major depressive disorder, single episode, unspecified: Secondary | ICD-10-CM | POA: Diagnosis not present

## 2015-07-10 DIAGNOSIS — E1122 Type 2 diabetes mellitus with diabetic chronic kidney disease: Secondary | ICD-10-CM | POA: Diagnosis not present

## 2015-07-10 DIAGNOSIS — E669 Obesity, unspecified: Secondary | ICD-10-CM | POA: Diagnosis not present

## 2015-07-10 DIAGNOSIS — D649 Anemia, unspecified: Secondary | ICD-10-CM | POA: Diagnosis not present

## 2015-07-10 DIAGNOSIS — M545 Low back pain: Secondary | ICD-10-CM | POA: Diagnosis not present

## 2015-07-10 DIAGNOSIS — I10 Essential (primary) hypertension: Secondary | ICD-10-CM | POA: Diagnosis not present

## 2015-07-10 DIAGNOSIS — E785 Hyperlipidemia, unspecified: Secondary | ICD-10-CM | POA: Diagnosis not present

## 2015-07-10 DIAGNOSIS — Z6835 Body mass index (BMI) 35.0-35.9, adult: Secondary | ICD-10-CM | POA: Diagnosis not present

## 2015-07-10 DIAGNOSIS — J309 Allergic rhinitis, unspecified: Secondary | ICD-10-CM | POA: Diagnosis not present

## 2015-08-08 DIAGNOSIS — K219 Gastro-esophageal reflux disease without esophagitis: Secondary | ICD-10-CM | POA: Diagnosis not present

## 2015-08-08 DIAGNOSIS — K5730 Diverticulosis of large intestine without perforation or abscess without bleeding: Secondary | ICD-10-CM | POA: Diagnosis not present

## 2015-10-16 DIAGNOSIS — M79672 Pain in left foot: Secondary | ICD-10-CM | POA: Diagnosis not present

## 2015-10-16 DIAGNOSIS — E785 Hyperlipidemia, unspecified: Secondary | ICD-10-CM | POA: Diagnosis not present

## 2015-10-16 DIAGNOSIS — F329 Major depressive disorder, single episode, unspecified: Secondary | ICD-10-CM | POA: Diagnosis not present

## 2015-10-16 DIAGNOSIS — I10 Essential (primary) hypertension: Secondary | ICD-10-CM | POA: Diagnosis not present

## 2015-10-16 DIAGNOSIS — E1122 Type 2 diabetes mellitus with diabetic chronic kidney disease: Secondary | ICD-10-CM | POA: Diagnosis not present

## 2015-10-16 DIAGNOSIS — Z9181 History of falling: Secondary | ICD-10-CM | POA: Diagnosis not present

## 2015-10-16 DIAGNOSIS — D487 Neoplasm of uncertain behavior of other specified sites: Secondary | ICD-10-CM | POA: Diagnosis not present

## 2015-10-16 DIAGNOSIS — Z6835 Body mass index (BMI) 35.0-35.9, adult: Secondary | ICD-10-CM | POA: Diagnosis not present

## 2015-11-01 DIAGNOSIS — E1122 Type 2 diabetes mellitus with diabetic chronic kidney disease: Secondary | ICD-10-CM | POA: Diagnosis not present

## 2015-11-19 DIAGNOSIS — E1122 Type 2 diabetes mellitus with diabetic chronic kidney disease: Secondary | ICD-10-CM | POA: Diagnosis not present

## 2015-11-19 DIAGNOSIS — E119 Type 2 diabetes mellitus without complications: Secondary | ICD-10-CM | POA: Diagnosis not present

## 2015-12-10 DIAGNOSIS — G43909 Migraine, unspecified, not intractable, without status migrainosus: Secondary | ICD-10-CM

## 2015-12-10 DIAGNOSIS — E1169 Type 2 diabetes mellitus with other specified complication: Secondary | ICD-10-CM

## 2015-12-10 DIAGNOSIS — F32A Depression, unspecified: Secondary | ICD-10-CM

## 2015-12-10 DIAGNOSIS — E785 Hyperlipidemia, unspecified: Secondary | ICD-10-CM | POA: Insufficient documentation

## 2015-12-10 DIAGNOSIS — E1122 Type 2 diabetes mellitus with diabetic chronic kidney disease: Secondary | ICD-10-CM | POA: Diagnosis not present

## 2015-12-10 DIAGNOSIS — N182 Chronic kidney disease, stage 2 (mild): Secondary | ICD-10-CM | POA: Diagnosis not present

## 2015-12-10 HISTORY — DX: Type 2 diabetes mellitus with other specified complication: E11.69

## 2015-12-10 HISTORY — DX: Depression, unspecified: F32.A

## 2015-12-10 HISTORY — DX: Migraine, unspecified, not intractable, without status migrainosus: G43.909

## 2016-01-24 DIAGNOSIS — D649 Anemia, unspecified: Secondary | ICD-10-CM | POA: Diagnosis not present

## 2016-01-24 DIAGNOSIS — E1122 Type 2 diabetes mellitus with diabetic chronic kidney disease: Secondary | ICD-10-CM | POA: Diagnosis not present

## 2016-01-24 DIAGNOSIS — E785 Hyperlipidemia, unspecified: Secondary | ICD-10-CM | POA: Diagnosis not present

## 2016-01-24 DIAGNOSIS — Z23 Encounter for immunization: Secondary | ICD-10-CM | POA: Diagnosis not present

## 2016-01-24 DIAGNOSIS — I10 Essential (primary) hypertension: Secondary | ICD-10-CM | POA: Diagnosis not present

## 2016-02-12 DIAGNOSIS — E669 Obesity, unspecified: Secondary | ICD-10-CM | POA: Insufficient documentation

## 2016-02-12 DIAGNOSIS — E1165 Type 2 diabetes mellitus with hyperglycemia: Secondary | ICD-10-CM | POA: Diagnosis not present

## 2016-02-12 DIAGNOSIS — E042 Nontoxic multinodular goiter: Secondary | ICD-10-CM | POA: Diagnosis not present

## 2016-02-12 DIAGNOSIS — E1129 Type 2 diabetes mellitus with other diabetic kidney complication: Secondary | ICD-10-CM | POA: Insufficient documentation

## 2016-02-12 DIAGNOSIS — E785 Hyperlipidemia, unspecified: Secondary | ICD-10-CM | POA: Diagnosis not present

## 2016-02-12 DIAGNOSIS — E1169 Type 2 diabetes mellitus with other specified complication: Secondary | ICD-10-CM | POA: Diagnosis not present

## 2016-02-12 DIAGNOSIS — Z794 Long term (current) use of insulin: Secondary | ICD-10-CM | POA: Diagnosis not present

## 2016-02-12 DIAGNOSIS — I10 Essential (primary) hypertension: Secondary | ICD-10-CM | POA: Diagnosis not present

## 2016-02-12 DIAGNOSIS — R809 Proteinuria, unspecified: Secondary | ICD-10-CM | POA: Diagnosis not present

## 2016-02-12 HISTORY — DX: Type 2 diabetes mellitus with other diabetic kidney complication: E11.29

## 2016-02-12 HISTORY — DX: Morbid (severe) obesity due to excess calories: E66.01

## 2016-02-12 HISTORY — DX: Type 2 diabetes mellitus with hyperglycemia: E11.65

## 2016-02-28 DIAGNOSIS — E11649 Type 2 diabetes mellitus with hypoglycemia without coma: Secondary | ICD-10-CM | POA: Diagnosis not present

## 2016-02-28 DIAGNOSIS — E10649 Type 1 diabetes mellitus with hypoglycemia without coma: Secondary | ICD-10-CM | POA: Diagnosis not present

## 2016-02-28 DIAGNOSIS — I959 Hypotension, unspecified: Secondary | ICD-10-CM | POA: Diagnosis not present

## 2016-02-28 DIAGNOSIS — Z794 Long term (current) use of insulin: Secondary | ICD-10-CM | POA: Diagnosis not present

## 2016-02-28 DIAGNOSIS — R4182 Altered mental status, unspecified: Secondary | ICD-10-CM | POA: Diagnosis not present

## 2016-02-28 DIAGNOSIS — E162 Hypoglycemia, unspecified: Secondary | ICD-10-CM | POA: Diagnosis not present

## 2016-03-05 DIAGNOSIS — E1122 Type 2 diabetes mellitus with diabetic chronic kidney disease: Secondary | ICD-10-CM | POA: Diagnosis not present

## 2016-03-05 DIAGNOSIS — I10 Essential (primary) hypertension: Secondary | ICD-10-CM | POA: Diagnosis not present

## 2016-03-05 DIAGNOSIS — M8589 Other specified disorders of bone density and structure, multiple sites: Secondary | ICD-10-CM | POA: Diagnosis not present

## 2016-03-05 DIAGNOSIS — L723 Sebaceous cyst: Secondary | ICD-10-CM | POA: Diagnosis not present

## 2016-03-05 DIAGNOSIS — E11649 Type 2 diabetes mellitus with hypoglycemia without coma: Secondary | ICD-10-CM | POA: Diagnosis not present

## 2016-03-11 DIAGNOSIS — Z1231 Encounter for screening mammogram for malignant neoplasm of breast: Secondary | ICD-10-CM | POA: Diagnosis not present

## 2016-03-24 DIAGNOSIS — H3581 Retinal edema: Secondary | ICD-10-CM | POA: Diagnosis not present

## 2016-04-16 DIAGNOSIS — M8589 Other specified disorders of bone density and structure, multiple sites: Secondary | ICD-10-CM | POA: Diagnosis not present

## 2016-04-16 DIAGNOSIS — M85852 Other specified disorders of bone density and structure, left thigh: Secondary | ICD-10-CM | POA: Diagnosis not present

## 2016-04-24 DIAGNOSIS — M8589 Other specified disorders of bone density and structure, multiple sites: Secondary | ICD-10-CM | POA: Diagnosis not present

## 2016-04-24 DIAGNOSIS — E785 Hyperlipidemia, unspecified: Secondary | ICD-10-CM | POA: Diagnosis not present

## 2016-04-24 DIAGNOSIS — I10 Essential (primary) hypertension: Secondary | ICD-10-CM | POA: Diagnosis not present

## 2016-04-24 DIAGNOSIS — Z1389 Encounter for screening for other disorder: Secondary | ICD-10-CM | POA: Diagnosis not present

## 2016-04-24 DIAGNOSIS — D638 Anemia in other chronic diseases classified elsewhere: Secondary | ICD-10-CM | POA: Diagnosis not present

## 2016-04-24 DIAGNOSIS — E1122 Type 2 diabetes mellitus with diabetic chronic kidney disease: Secondary | ICD-10-CM | POA: Diagnosis not present

## 2016-05-13 DIAGNOSIS — E1169 Type 2 diabetes mellitus with other specified complication: Secondary | ICD-10-CM | POA: Diagnosis not present

## 2016-05-13 DIAGNOSIS — E1129 Type 2 diabetes mellitus with other diabetic kidney complication: Secondary | ICD-10-CM | POA: Diagnosis not present

## 2016-05-13 DIAGNOSIS — N183 Chronic kidney disease, stage 3 (moderate): Secondary | ICD-10-CM | POA: Diagnosis not present

## 2016-05-13 DIAGNOSIS — Z794 Long term (current) use of insulin: Secondary | ICD-10-CM | POA: Diagnosis not present

## 2016-05-13 DIAGNOSIS — E1142 Type 2 diabetes mellitus with diabetic polyneuropathy: Secondary | ICD-10-CM | POA: Diagnosis not present

## 2016-05-13 DIAGNOSIS — E042 Nontoxic multinodular goiter: Secondary | ICD-10-CM | POA: Diagnosis not present

## 2016-05-13 DIAGNOSIS — E119 Type 2 diabetes mellitus without complications: Secondary | ICD-10-CM | POA: Diagnosis not present

## 2016-05-13 DIAGNOSIS — R809 Proteinuria, unspecified: Secondary | ICD-10-CM | POA: Diagnosis not present

## 2016-05-14 DIAGNOSIS — N183 Chronic kidney disease, stage 3 unspecified: Secondary | ICD-10-CM

## 2016-05-14 HISTORY — DX: Chronic kidney disease, stage 3 unspecified: N18.30

## 2016-06-04 DIAGNOSIS — R809 Proteinuria, unspecified: Secondary | ICD-10-CM | POA: Diagnosis not present

## 2016-06-04 DIAGNOSIS — I129 Hypertensive chronic kidney disease with stage 1 through stage 4 chronic kidney disease, or unspecified chronic kidney disease: Secondary | ICD-10-CM | POA: Diagnosis not present

## 2016-06-04 DIAGNOSIS — N183 Chronic kidney disease, stage 3 (moderate): Secondary | ICD-10-CM | POA: Diagnosis not present

## 2016-06-04 DIAGNOSIS — E1129 Type 2 diabetes mellitus with other diabetic kidney complication: Secondary | ICD-10-CM | POA: Diagnosis not present

## 2016-06-04 DIAGNOSIS — N281 Cyst of kidney, acquired: Secondary | ICD-10-CM | POA: Diagnosis not present

## 2016-07-23 DIAGNOSIS — E1122 Type 2 diabetes mellitus with diabetic chronic kidney disease: Secondary | ICD-10-CM | POA: Diagnosis not present

## 2016-07-23 DIAGNOSIS — Z6835 Body mass index (BMI) 35.0-35.9, adult: Secondary | ICD-10-CM | POA: Diagnosis not present

## 2016-07-23 DIAGNOSIS — G4700 Insomnia, unspecified: Secondary | ICD-10-CM | POA: Diagnosis not present

## 2016-07-23 DIAGNOSIS — E785 Hyperlipidemia, unspecified: Secondary | ICD-10-CM | POA: Diagnosis not present

## 2016-07-23 DIAGNOSIS — E669 Obesity, unspecified: Secondary | ICD-10-CM | POA: Diagnosis not present

## 2016-07-23 DIAGNOSIS — F329 Major depressive disorder, single episode, unspecified: Secondary | ICD-10-CM | POA: Diagnosis not present

## 2016-07-23 DIAGNOSIS — D638 Anemia in other chronic diseases classified elsewhere: Secondary | ICD-10-CM | POA: Diagnosis not present

## 2016-08-13 DIAGNOSIS — E1129 Type 2 diabetes mellitus with other diabetic kidney complication: Secondary | ICD-10-CM | POA: Diagnosis not present

## 2016-08-13 DIAGNOSIS — N183 Chronic kidney disease, stage 3 (moderate): Secondary | ICD-10-CM | POA: Diagnosis not present

## 2016-08-13 DIAGNOSIS — R809 Proteinuria, unspecified: Secondary | ICD-10-CM | POA: Diagnosis not present

## 2016-08-13 DIAGNOSIS — E1165 Type 2 diabetes mellitus with hyperglycemia: Secondary | ICD-10-CM | POA: Diagnosis not present

## 2016-08-13 DIAGNOSIS — E1142 Type 2 diabetes mellitus with diabetic polyneuropathy: Secondary | ICD-10-CM | POA: Diagnosis not present

## 2016-08-13 DIAGNOSIS — E785 Hyperlipidemia, unspecified: Secondary | ICD-10-CM | POA: Diagnosis not present

## 2016-08-13 DIAGNOSIS — E1169 Type 2 diabetes mellitus with other specified complication: Secondary | ICD-10-CM | POA: Diagnosis not present

## 2016-08-13 DIAGNOSIS — Z794 Long term (current) use of insulin: Secondary | ICD-10-CM | POA: Diagnosis not present

## 2016-09-01 DIAGNOSIS — Z139 Encounter for screening, unspecified: Secondary | ICD-10-CM | POA: Diagnosis not present

## 2016-09-01 DIAGNOSIS — L723 Sebaceous cyst: Secondary | ICD-10-CM | POA: Diagnosis not present

## 2016-09-01 DIAGNOSIS — M545 Low back pain: Secondary | ICD-10-CM | POA: Diagnosis not present

## 2016-09-09 DIAGNOSIS — M545 Low back pain: Secondary | ICD-10-CM | POA: Diagnosis not present

## 2016-09-09 DIAGNOSIS — M1612 Unilateral primary osteoarthritis, left hip: Secondary | ICD-10-CM | POA: Diagnosis not present

## 2016-09-12 DIAGNOSIS — M1612 Unilateral primary osteoarthritis, left hip: Secondary | ICD-10-CM | POA: Diagnosis not present

## 2016-09-24 DIAGNOSIS — M47896 Other spondylosis, lumbar region: Secondary | ICD-10-CM | POA: Diagnosis not present

## 2016-09-24 DIAGNOSIS — M545 Low back pain: Secondary | ICD-10-CM | POA: Diagnosis not present

## 2016-09-24 DIAGNOSIS — M5126 Other intervertebral disc displacement, lumbar region: Secondary | ICD-10-CM | POA: Diagnosis not present

## 2016-10-07 DIAGNOSIS — M545 Low back pain: Secondary | ICD-10-CM | POA: Diagnosis not present

## 2016-10-10 DIAGNOSIS — N183 Chronic kidney disease, stage 3 (moderate): Secondary | ICD-10-CM | POA: Diagnosis not present

## 2016-10-10 DIAGNOSIS — I129 Hypertensive chronic kidney disease with stage 1 through stage 4 chronic kidney disease, or unspecified chronic kidney disease: Secondary | ICD-10-CM | POA: Diagnosis not present

## 2016-10-14 DIAGNOSIS — Z794 Long term (current) use of insulin: Secondary | ICD-10-CM | POA: Diagnosis not present

## 2016-10-14 DIAGNOSIS — I129 Hypertensive chronic kidney disease with stage 1 through stage 4 chronic kidney disease, or unspecified chronic kidney disease: Secondary | ICD-10-CM | POA: Diagnosis not present

## 2016-10-14 DIAGNOSIS — R809 Proteinuria, unspecified: Secondary | ICD-10-CM | POA: Diagnosis not present

## 2016-10-14 DIAGNOSIS — N183 Chronic kidney disease, stage 3 (moderate): Secondary | ICD-10-CM | POA: Diagnosis not present

## 2016-10-14 DIAGNOSIS — E1122 Type 2 diabetes mellitus with diabetic chronic kidney disease: Secondary | ICD-10-CM | POA: Diagnosis not present

## 2016-10-14 DIAGNOSIS — E1165 Type 2 diabetes mellitus with hyperglycemia: Secondary | ICD-10-CM | POA: Diagnosis not present

## 2016-11-05 DIAGNOSIS — I10 Essential (primary) hypertension: Secondary | ICD-10-CM | POA: Diagnosis not present

## 2016-11-05 DIAGNOSIS — E1122 Type 2 diabetes mellitus with diabetic chronic kidney disease: Secondary | ICD-10-CM | POA: Diagnosis not present

## 2016-11-05 DIAGNOSIS — Z9181 History of falling: Secondary | ICD-10-CM | POA: Diagnosis not present

## 2016-11-05 DIAGNOSIS — F329 Major depressive disorder, single episode, unspecified: Secondary | ICD-10-CM | POA: Diagnosis not present

## 2016-11-05 DIAGNOSIS — E785 Hyperlipidemia, unspecified: Secondary | ICD-10-CM | POA: Diagnosis not present

## 2016-11-12 DIAGNOSIS — I10 Essential (primary) hypertension: Secondary | ICD-10-CM | POA: Diagnosis not present

## 2016-11-12 DIAGNOSIS — L02219 Cutaneous abscess of trunk, unspecified: Secondary | ICD-10-CM | POA: Diagnosis not present

## 2016-11-17 DIAGNOSIS — E119 Type 2 diabetes mellitus without complications: Secondary | ICD-10-CM | POA: Diagnosis not present

## 2016-11-17 DIAGNOSIS — M545 Low back pain: Secondary | ICD-10-CM | POA: Diagnosis not present

## 2016-12-03 DIAGNOSIS — Z794 Long term (current) use of insulin: Secondary | ICD-10-CM | POA: Diagnosis not present

## 2016-12-03 DIAGNOSIS — E1165 Type 2 diabetes mellitus with hyperglycemia: Secondary | ICD-10-CM | POA: Diagnosis not present

## 2016-12-03 DIAGNOSIS — I10 Essential (primary) hypertension: Secondary | ICD-10-CM | POA: Diagnosis not present

## 2016-12-03 DIAGNOSIS — E1142 Type 2 diabetes mellitus with diabetic polyneuropathy: Secondary | ICD-10-CM | POA: Diagnosis not present

## 2016-12-03 DIAGNOSIS — E042 Nontoxic multinodular goiter: Secondary | ICD-10-CM | POA: Diagnosis not present

## 2016-12-03 DIAGNOSIS — E1129 Type 2 diabetes mellitus with other diabetic kidney complication: Secondary | ICD-10-CM | POA: Diagnosis not present

## 2016-12-03 DIAGNOSIS — E1169 Type 2 diabetes mellitus with other specified complication: Secondary | ICD-10-CM | POA: Diagnosis not present

## 2016-12-03 DIAGNOSIS — N183 Chronic kidney disease, stage 3 (moderate): Secondary | ICD-10-CM | POA: Diagnosis not present

## 2016-12-20 DIAGNOSIS — R062 Wheezing: Secondary | ICD-10-CM | POA: Diagnosis not present

## 2016-12-20 DIAGNOSIS — R05 Cough: Secondary | ICD-10-CM | POA: Diagnosis not present

## 2016-12-30 DIAGNOSIS — H26491 Other secondary cataract, right eye: Secondary | ICD-10-CM | POA: Diagnosis not present

## 2017-01-07 DIAGNOSIS — Z7984 Long term (current) use of oral hypoglycemic drugs: Secondary | ICD-10-CM | POA: Diagnosis not present

## 2017-01-07 DIAGNOSIS — N183 Chronic kidney disease, stage 3 (moderate): Secondary | ICD-10-CM | POA: Diagnosis not present

## 2017-01-07 DIAGNOSIS — E1122 Type 2 diabetes mellitus with diabetic chronic kidney disease: Secondary | ICD-10-CM | POA: Diagnosis not present

## 2017-02-11 DIAGNOSIS — Z23 Encounter for immunization: Secondary | ICD-10-CM | POA: Diagnosis not present

## 2017-02-11 DIAGNOSIS — E785 Hyperlipidemia, unspecified: Secondary | ICD-10-CM | POA: Diagnosis not present

## 2017-02-11 DIAGNOSIS — I10 Essential (primary) hypertension: Secondary | ICD-10-CM | POA: Diagnosis not present

## 2017-02-11 DIAGNOSIS — F329 Major depressive disorder, single episode, unspecified: Secondary | ICD-10-CM | POA: Diagnosis not present

## 2017-02-11 DIAGNOSIS — E1122 Type 2 diabetes mellitus with diabetic chronic kidney disease: Secondary | ICD-10-CM | POA: Diagnosis not present

## 2017-02-13 DIAGNOSIS — N183 Chronic kidney disease, stage 3 (moderate): Secondary | ICD-10-CM | POA: Diagnosis not present

## 2017-02-18 DIAGNOSIS — E1122 Type 2 diabetes mellitus with diabetic chronic kidney disease: Secondary | ICD-10-CM | POA: Diagnosis not present

## 2017-02-18 DIAGNOSIS — Z794 Long term (current) use of insulin: Secondary | ICD-10-CM | POA: Diagnosis not present

## 2017-02-18 DIAGNOSIS — I129 Hypertensive chronic kidney disease with stage 1 through stage 4 chronic kidney disease, or unspecified chronic kidney disease: Secondary | ICD-10-CM | POA: Diagnosis not present

## 2017-02-18 DIAGNOSIS — R809 Proteinuria, unspecified: Secondary | ICD-10-CM | POA: Diagnosis not present

## 2017-02-18 DIAGNOSIS — N183 Chronic kidney disease, stage 3 (moderate): Secondary | ICD-10-CM | POA: Diagnosis not present

## 2017-02-18 DIAGNOSIS — E1165 Type 2 diabetes mellitus with hyperglycemia: Secondary | ICD-10-CM | POA: Diagnosis not present

## 2017-03-05 DIAGNOSIS — E1165 Type 2 diabetes mellitus with hyperglycemia: Secondary | ICD-10-CM | POA: Diagnosis not present

## 2017-03-05 DIAGNOSIS — E1129 Type 2 diabetes mellitus with other diabetic kidney complication: Secondary | ICD-10-CM | POA: Diagnosis not present

## 2017-03-05 DIAGNOSIS — I129 Hypertensive chronic kidney disease with stage 1 through stage 4 chronic kidney disease, or unspecified chronic kidney disease: Secondary | ICD-10-CM | POA: Diagnosis not present

## 2017-03-05 DIAGNOSIS — R809 Proteinuria, unspecified: Secondary | ICD-10-CM | POA: Diagnosis not present

## 2017-03-05 DIAGNOSIS — E1169 Type 2 diabetes mellitus with other specified complication: Secondary | ICD-10-CM | POA: Diagnosis not present

## 2017-03-05 DIAGNOSIS — E1122 Type 2 diabetes mellitus with diabetic chronic kidney disease: Secondary | ICD-10-CM | POA: Diagnosis not present

## 2017-03-05 DIAGNOSIS — E785 Hyperlipidemia, unspecified: Secondary | ICD-10-CM | POA: Diagnosis not present

## 2017-03-05 DIAGNOSIS — N183 Chronic kidney disease, stage 3 (moderate): Secondary | ICD-10-CM | POA: Diagnosis not present

## 2017-03-24 DIAGNOSIS — Z9181 History of falling: Secondary | ICD-10-CM | POA: Diagnosis not present

## 2017-03-24 DIAGNOSIS — Z136 Encounter for screening for cardiovascular disorders: Secondary | ICD-10-CM | POA: Diagnosis not present

## 2017-03-24 DIAGNOSIS — Z1331 Encounter for screening for depression: Secondary | ICD-10-CM | POA: Diagnosis not present

## 2017-03-24 DIAGNOSIS — Z6836 Body mass index (BMI) 36.0-36.9, adult: Secondary | ICD-10-CM | POA: Diagnosis not present

## 2017-03-24 DIAGNOSIS — Z0000 Encounter for general adult medical examination without abnormal findings: Secondary | ICD-10-CM | POA: Diagnosis not present

## 2017-03-24 DIAGNOSIS — Z23 Encounter for immunization: Secondary | ICD-10-CM | POA: Diagnosis not present

## 2017-03-24 DIAGNOSIS — Z1231 Encounter for screening mammogram for malignant neoplasm of breast: Secondary | ICD-10-CM | POA: Diagnosis not present

## 2017-03-24 DIAGNOSIS — E669 Obesity, unspecified: Secondary | ICD-10-CM | POA: Diagnosis not present

## 2017-03-24 DIAGNOSIS — E785 Hyperlipidemia, unspecified: Secondary | ICD-10-CM | POA: Diagnosis not present

## 2017-04-11 DIAGNOSIS — Z1231 Encounter for screening mammogram for malignant neoplasm of breast: Secondary | ICD-10-CM | POA: Diagnosis not present

## 2017-06-10 DIAGNOSIS — N183 Chronic kidney disease, stage 3 (moderate): Secondary | ICD-10-CM | POA: Diagnosis not present

## 2017-06-10 DIAGNOSIS — R350 Frequency of micturition: Secondary | ICD-10-CM

## 2017-06-10 DIAGNOSIS — R809 Proteinuria, unspecified: Secondary | ICD-10-CM | POA: Diagnosis not present

## 2017-06-10 DIAGNOSIS — Z794 Long term (current) use of insulin: Secondary | ICD-10-CM | POA: Diagnosis not present

## 2017-06-10 DIAGNOSIS — E1169 Type 2 diabetes mellitus with other specified complication: Secondary | ICD-10-CM | POA: Diagnosis not present

## 2017-06-10 DIAGNOSIS — E1129 Type 2 diabetes mellitus with other diabetic kidney complication: Secondary | ICD-10-CM | POA: Diagnosis not present

## 2017-06-10 DIAGNOSIS — E1165 Type 2 diabetes mellitus with hyperglycemia: Secondary | ICD-10-CM | POA: Diagnosis not present

## 2017-06-10 HISTORY — DX: Frequency of micturition: R35.0

## 2017-06-15 DIAGNOSIS — N183 Chronic kidney disease, stage 3 (moderate): Secondary | ICD-10-CM | POA: Diagnosis not present

## 2017-06-23 DIAGNOSIS — N2581 Secondary hyperparathyroidism of renal origin: Secondary | ICD-10-CM | POA: Diagnosis not present

## 2017-06-23 DIAGNOSIS — E1129 Type 2 diabetes mellitus with other diabetic kidney complication: Secondary | ICD-10-CM | POA: Diagnosis not present

## 2017-06-23 DIAGNOSIS — IMO0002 Reserved for concepts with insufficient information to code with codable children: Secondary | ICD-10-CM

## 2017-06-23 DIAGNOSIS — R809 Proteinuria, unspecified: Secondary | ICD-10-CM | POA: Diagnosis not present

## 2017-06-23 DIAGNOSIS — N183 Chronic kidney disease, stage 3 unspecified: Secondary | ICD-10-CM

## 2017-06-23 DIAGNOSIS — E1165 Type 2 diabetes mellitus with hyperglycemia: Secondary | ICD-10-CM | POA: Diagnosis not present

## 2017-06-23 DIAGNOSIS — I129 Hypertensive chronic kidney disease with stage 1 through stage 4 chronic kidney disease, or unspecified chronic kidney disease: Secondary | ICD-10-CM | POA: Diagnosis not present

## 2017-06-23 DIAGNOSIS — E1122 Type 2 diabetes mellitus with diabetic chronic kidney disease: Secondary | ICD-10-CM | POA: Diagnosis not present

## 2017-06-23 HISTORY — DX: Reserved for concepts with insufficient information to code with codable children: IMO0002

## 2017-06-23 HISTORY — DX: Chronic kidney disease, stage 3 unspecified: N18.30

## 2017-06-23 HISTORY — DX: Secondary hyperparathyroidism of renal origin: N25.81

## 2017-07-20 DIAGNOSIS — E119 Type 2 diabetes mellitus without complications: Secondary | ICD-10-CM | POA: Diagnosis not present

## 2017-08-18 DIAGNOSIS — F329 Major depressive disorder, single episode, unspecified: Secondary | ICD-10-CM | POA: Diagnosis not present

## 2017-08-18 DIAGNOSIS — Z6837 Body mass index (BMI) 37.0-37.9, adult: Secondary | ICD-10-CM | POA: Diagnosis not present

## 2017-08-18 DIAGNOSIS — N183 Chronic kidney disease, stage 3 (moderate): Secondary | ICD-10-CM | POA: Diagnosis not present

## 2017-08-18 DIAGNOSIS — E785 Hyperlipidemia, unspecified: Secondary | ICD-10-CM | POA: Diagnosis not present

## 2017-08-18 DIAGNOSIS — G44229 Chronic tension-type headache, not intractable: Secondary | ICD-10-CM | POA: Diagnosis not present

## 2017-08-18 DIAGNOSIS — E1122 Type 2 diabetes mellitus with diabetic chronic kidney disease: Secondary | ICD-10-CM | POA: Diagnosis not present

## 2017-08-18 DIAGNOSIS — D638 Anemia in other chronic diseases classified elsewhere: Secondary | ICD-10-CM | POA: Diagnosis not present

## 2017-08-18 DIAGNOSIS — I10 Essential (primary) hypertension: Secondary | ICD-10-CM | POA: Diagnosis not present

## 2017-08-25 DIAGNOSIS — E875 Hyperkalemia: Secondary | ICD-10-CM | POA: Diagnosis not present

## 2017-09-08 DIAGNOSIS — N183 Chronic kidney disease, stage 3 (moderate): Secondary | ICD-10-CM | POA: Diagnosis not present

## 2017-09-08 DIAGNOSIS — R809 Proteinuria, unspecified: Secondary | ICD-10-CM | POA: Diagnosis not present

## 2017-09-08 DIAGNOSIS — E042 Nontoxic multinodular goiter: Secondary | ICD-10-CM | POA: Diagnosis not present

## 2017-09-08 DIAGNOSIS — E1165 Type 2 diabetes mellitus with hyperglycemia: Secondary | ICD-10-CM | POA: Diagnosis not present

## 2017-09-08 DIAGNOSIS — Z794 Long term (current) use of insulin: Secondary | ICD-10-CM | POA: Diagnosis not present

## 2017-09-08 DIAGNOSIS — E1169 Type 2 diabetes mellitus with other specified complication: Secondary | ICD-10-CM | POA: Diagnosis not present

## 2017-09-08 DIAGNOSIS — E1129 Type 2 diabetes mellitus with other diabetic kidney complication: Secondary | ICD-10-CM | POA: Diagnosis not present

## 2017-09-08 DIAGNOSIS — N2581 Secondary hyperparathyroidism of renal origin: Secondary | ICD-10-CM | POA: Diagnosis not present

## 2017-10-23 DIAGNOSIS — N183 Chronic kidney disease, stage 3 (moderate): Secondary | ICD-10-CM | POA: Diagnosis not present

## 2017-10-27 DIAGNOSIS — Z794 Long term (current) use of insulin: Secondary | ICD-10-CM | POA: Diagnosis not present

## 2017-10-27 DIAGNOSIS — E1122 Type 2 diabetes mellitus with diabetic chronic kidney disease: Secondary | ICD-10-CM | POA: Diagnosis not present

## 2017-10-27 DIAGNOSIS — E1165 Type 2 diabetes mellitus with hyperglycemia: Secondary | ICD-10-CM | POA: Diagnosis not present

## 2017-10-27 DIAGNOSIS — R809 Proteinuria, unspecified: Secondary | ICD-10-CM | POA: Diagnosis not present

## 2017-10-27 DIAGNOSIS — I129 Hypertensive chronic kidney disease with stage 1 through stage 4 chronic kidney disease, or unspecified chronic kidney disease: Secondary | ICD-10-CM | POA: Diagnosis not present

## 2017-10-27 DIAGNOSIS — E1129 Type 2 diabetes mellitus with other diabetic kidney complication: Secondary | ICD-10-CM | POA: Diagnosis not present

## 2017-10-27 DIAGNOSIS — N183 Chronic kidney disease, stage 3 (moderate): Secondary | ICD-10-CM | POA: Diagnosis not present

## 2017-12-08 DIAGNOSIS — E1122 Type 2 diabetes mellitus with diabetic chronic kidney disease: Secondary | ICD-10-CM | POA: Diagnosis not present

## 2017-12-08 DIAGNOSIS — N183 Chronic kidney disease, stage 3 (moderate): Secondary | ICD-10-CM | POA: Diagnosis not present

## 2017-12-08 DIAGNOSIS — Z6837 Body mass index (BMI) 37.0-37.9, adult: Secondary | ICD-10-CM | POA: Diagnosis not present

## 2017-12-08 DIAGNOSIS — I10 Essential (primary) hypertension: Secondary | ICD-10-CM | POA: Diagnosis not present

## 2017-12-08 DIAGNOSIS — E785 Hyperlipidemia, unspecified: Secondary | ICD-10-CM | POA: Diagnosis not present

## 2017-12-08 DIAGNOSIS — F329 Major depressive disorder, single episode, unspecified: Secondary | ICD-10-CM | POA: Diagnosis not present

## 2017-12-08 DIAGNOSIS — E669 Obesity, unspecified: Secondary | ICD-10-CM | POA: Diagnosis not present

## 2017-12-09 DIAGNOSIS — Z6836 Body mass index (BMI) 36.0-36.9, adult: Secondary | ICD-10-CM | POA: Diagnosis not present

## 2017-12-09 DIAGNOSIS — E042 Nontoxic multinodular goiter: Secondary | ICD-10-CM | POA: Diagnosis not present

## 2017-12-09 DIAGNOSIS — Z794 Long term (current) use of insulin: Secondary | ICD-10-CM | POA: Diagnosis not present

## 2017-12-09 DIAGNOSIS — E1169 Type 2 diabetes mellitus with other specified complication: Secondary | ICD-10-CM | POA: Diagnosis not present

## 2017-12-09 DIAGNOSIS — E1165 Type 2 diabetes mellitus with hyperglycemia: Secondary | ICD-10-CM | POA: Diagnosis not present

## 2017-12-09 DIAGNOSIS — M79672 Pain in left foot: Secondary | ICD-10-CM | POA: Diagnosis not present

## 2017-12-09 DIAGNOSIS — E1142 Type 2 diabetes mellitus with diabetic polyneuropathy: Secondary | ICD-10-CM | POA: Diagnosis not present

## 2017-12-09 DIAGNOSIS — H538 Other visual disturbances: Secondary | ICD-10-CM | POA: Diagnosis not present

## 2017-12-23 DIAGNOSIS — N183 Chronic kidney disease, stage 3 (moderate): Secondary | ICD-10-CM | POA: Diagnosis not present

## 2017-12-29 DIAGNOSIS — R809 Proteinuria, unspecified: Secondary | ICD-10-CM | POA: Diagnosis not present

## 2017-12-29 DIAGNOSIS — I129 Hypertensive chronic kidney disease with stage 1 through stage 4 chronic kidney disease, or unspecified chronic kidney disease: Secondary | ICD-10-CM | POA: Diagnosis not present

## 2017-12-29 DIAGNOSIS — N183 Chronic kidney disease, stage 3 (moderate): Secondary | ICD-10-CM | POA: Diagnosis not present

## 2017-12-29 DIAGNOSIS — E1165 Type 2 diabetes mellitus with hyperglycemia: Secondary | ICD-10-CM | POA: Diagnosis not present

## 2017-12-29 DIAGNOSIS — E1129 Type 2 diabetes mellitus with other diabetic kidney complication: Secondary | ICD-10-CM | POA: Diagnosis not present

## 2017-12-29 DIAGNOSIS — E1122 Type 2 diabetes mellitus with diabetic chronic kidney disease: Secondary | ICD-10-CM | POA: Diagnosis not present

## 2018-03-11 DIAGNOSIS — I10 Essential (primary) hypertension: Secondary | ICD-10-CM | POA: Diagnosis not present

## 2018-03-11 DIAGNOSIS — E1122 Type 2 diabetes mellitus with diabetic chronic kidney disease: Secondary | ICD-10-CM | POA: Diagnosis not present

## 2018-03-11 DIAGNOSIS — E785 Hyperlipidemia, unspecified: Secondary | ICD-10-CM | POA: Diagnosis not present

## 2018-03-11 DIAGNOSIS — Z6836 Body mass index (BMI) 36.0-36.9, adult: Secondary | ICD-10-CM | POA: Diagnosis not present

## 2018-03-11 DIAGNOSIS — Z139 Encounter for screening, unspecified: Secondary | ICD-10-CM | POA: Diagnosis not present

## 2018-03-11 DIAGNOSIS — Z1339 Encounter for screening examination for other mental health and behavioral disorders: Secondary | ICD-10-CM | POA: Diagnosis not present

## 2018-03-11 DIAGNOSIS — N183 Chronic kidney disease, stage 3 (moderate): Secondary | ICD-10-CM | POA: Diagnosis not present

## 2018-03-11 DIAGNOSIS — Z23 Encounter for immunization: Secondary | ICD-10-CM | POA: Diagnosis not present

## 2018-03-11 DIAGNOSIS — F329 Major depressive disorder, single episode, unspecified: Secondary | ICD-10-CM | POA: Diagnosis not present

## 2018-03-30 DIAGNOSIS — N183 Chronic kidney disease, stage 3 (moderate): Secondary | ICD-10-CM | POA: Diagnosis not present

## 2018-04-02 DIAGNOSIS — E1165 Type 2 diabetes mellitus with hyperglycemia: Secondary | ICD-10-CM | POA: Diagnosis not present

## 2018-04-02 DIAGNOSIS — N183 Chronic kidney disease, stage 3 (moderate): Secondary | ICD-10-CM | POA: Diagnosis not present

## 2018-04-02 DIAGNOSIS — R809 Proteinuria, unspecified: Secondary | ICD-10-CM | POA: Diagnosis not present

## 2018-04-02 DIAGNOSIS — N2581 Secondary hyperparathyroidism of renal origin: Secondary | ICD-10-CM | POA: Diagnosis not present

## 2018-04-07 DIAGNOSIS — I129 Hypertensive chronic kidney disease with stage 1 through stage 4 chronic kidney disease, or unspecified chronic kidney disease: Secondary | ICD-10-CM | POA: Diagnosis not present

## 2018-04-07 DIAGNOSIS — E1165 Type 2 diabetes mellitus with hyperglycemia: Secondary | ICD-10-CM | POA: Diagnosis not present

## 2018-04-07 DIAGNOSIS — E1129 Type 2 diabetes mellitus with other diabetic kidney complication: Secondary | ICD-10-CM | POA: Diagnosis not present

## 2018-04-07 DIAGNOSIS — R809 Proteinuria, unspecified: Secondary | ICD-10-CM | POA: Diagnosis not present

## 2018-04-07 DIAGNOSIS — N183 Chronic kidney disease, stage 3 (moderate): Secondary | ICD-10-CM | POA: Diagnosis not present

## 2018-04-07 DIAGNOSIS — E1122 Type 2 diabetes mellitus with diabetic chronic kidney disease: Secondary | ICD-10-CM | POA: Diagnosis not present

## 2018-06-15 DIAGNOSIS — E1122 Type 2 diabetes mellitus with diabetic chronic kidney disease: Secondary | ICD-10-CM | POA: Diagnosis not present

## 2018-06-15 DIAGNOSIS — E785 Hyperlipidemia, unspecified: Secondary | ICD-10-CM | POA: Diagnosis not present

## 2018-06-15 DIAGNOSIS — Z6837 Body mass index (BMI) 37.0-37.9, adult: Secondary | ICD-10-CM | POA: Diagnosis not present

## 2018-06-15 DIAGNOSIS — N183 Chronic kidney disease, stage 3 (moderate): Secondary | ICD-10-CM | POA: Diagnosis not present

## 2018-06-15 DIAGNOSIS — F321 Major depressive disorder, single episode, moderate: Secondary | ICD-10-CM | POA: Diagnosis not present

## 2018-06-15 DIAGNOSIS — K219 Gastro-esophageal reflux disease without esophagitis: Secondary | ICD-10-CM | POA: Diagnosis not present

## 2018-06-15 DIAGNOSIS — I10 Essential (primary) hypertension: Secondary | ICD-10-CM | POA: Diagnosis not present

## 2018-06-15 DIAGNOSIS — D638 Anemia in other chronic diseases classified elsewhere: Secondary | ICD-10-CM | POA: Diagnosis not present

## 2018-07-06 DIAGNOSIS — E1165 Type 2 diabetes mellitus with hyperglycemia: Secondary | ICD-10-CM | POA: Diagnosis not present

## 2018-07-06 DIAGNOSIS — R809 Proteinuria, unspecified: Secondary | ICD-10-CM | POA: Diagnosis not present

## 2018-07-06 DIAGNOSIS — N183 Chronic kidney disease, stage 3 (moderate): Secondary | ICD-10-CM | POA: Diagnosis not present

## 2018-07-06 DIAGNOSIS — N2581 Secondary hyperparathyroidism of renal origin: Secondary | ICD-10-CM | POA: Diagnosis not present

## 2018-07-08 DIAGNOSIS — I129 Hypertensive chronic kidney disease with stage 1 through stage 4 chronic kidney disease, or unspecified chronic kidney disease: Secondary | ICD-10-CM | POA: Diagnosis not present

## 2018-07-08 DIAGNOSIS — E1165 Type 2 diabetes mellitus with hyperglycemia: Secondary | ICD-10-CM | POA: Diagnosis not present

## 2018-07-08 DIAGNOSIS — R809 Proteinuria, unspecified: Secondary | ICD-10-CM | POA: Diagnosis not present

## 2018-07-08 DIAGNOSIS — E1122 Type 2 diabetes mellitus with diabetic chronic kidney disease: Secondary | ICD-10-CM | POA: Diagnosis not present

## 2018-07-08 DIAGNOSIS — E1129 Type 2 diabetes mellitus with other diabetic kidney complication: Secondary | ICD-10-CM | POA: Diagnosis not present

## 2018-07-08 DIAGNOSIS — N183 Chronic kidney disease, stage 3 (moderate): Secondary | ICD-10-CM | POA: Diagnosis not present

## 2018-07-13 DIAGNOSIS — Z1231 Encounter for screening mammogram for malignant neoplasm of breast: Secondary | ICD-10-CM | POA: Diagnosis not present

## 2018-09-14 DIAGNOSIS — S40022A Contusion of left upper arm, initial encounter: Secondary | ICD-10-CM | POA: Diagnosis not present

## 2018-09-14 DIAGNOSIS — E785 Hyperlipidemia, unspecified: Secondary | ICD-10-CM | POA: Diagnosis not present

## 2018-09-14 DIAGNOSIS — N183 Chronic kidney disease, stage 3 (moderate): Secondary | ICD-10-CM | POA: Diagnosis not present

## 2018-09-14 DIAGNOSIS — Z9181 History of falling: Secondary | ICD-10-CM | POA: Diagnosis not present

## 2018-09-14 DIAGNOSIS — Z6837 Body mass index (BMI) 37.0-37.9, adult: Secondary | ICD-10-CM | POA: Diagnosis not present

## 2018-09-14 DIAGNOSIS — F321 Major depressive disorder, single episode, moderate: Secondary | ICD-10-CM | POA: Diagnosis not present

## 2018-09-14 DIAGNOSIS — Z1331 Encounter for screening for depression: Secondary | ICD-10-CM | POA: Diagnosis not present

## 2018-09-14 DIAGNOSIS — E1122 Type 2 diabetes mellitus with diabetic chronic kidney disease: Secondary | ICD-10-CM | POA: Diagnosis not present

## 2018-09-14 DIAGNOSIS — I10 Essential (primary) hypertension: Secondary | ICD-10-CM | POA: Diagnosis not present

## 2018-09-15 DIAGNOSIS — E119 Type 2 diabetes mellitus without complications: Secondary | ICD-10-CM | POA: Diagnosis not present

## 2018-11-05 DIAGNOSIS — N183 Chronic kidney disease, stage 3 (moderate): Secondary | ICD-10-CM | POA: Diagnosis not present

## 2018-11-09 DIAGNOSIS — E1122 Type 2 diabetes mellitus with diabetic chronic kidney disease: Secondary | ICD-10-CM | POA: Diagnosis not present

## 2018-11-09 DIAGNOSIS — E1165 Type 2 diabetes mellitus with hyperglycemia: Secondary | ICD-10-CM | POA: Diagnosis not present

## 2018-11-09 DIAGNOSIS — I129 Hypertensive chronic kidney disease with stage 1 through stage 4 chronic kidney disease, or unspecified chronic kidney disease: Secondary | ICD-10-CM | POA: Diagnosis not present

## 2018-11-09 DIAGNOSIS — N183 Chronic kidney disease, stage 3 (moderate): Secondary | ICD-10-CM | POA: Diagnosis not present

## 2018-11-10 DIAGNOSIS — N2581 Secondary hyperparathyroidism of renal origin: Secondary | ICD-10-CM | POA: Diagnosis not present

## 2018-11-10 DIAGNOSIS — E1165 Type 2 diabetes mellitus with hyperglycemia: Secondary | ICD-10-CM | POA: Diagnosis not present

## 2018-11-10 DIAGNOSIS — E042 Nontoxic multinodular goiter: Secondary | ICD-10-CM | POA: Diagnosis not present

## 2018-11-10 DIAGNOSIS — E782 Mixed hyperlipidemia: Secondary | ICD-10-CM | POA: Diagnosis not present

## 2018-12-30 DIAGNOSIS — E785 Hyperlipidemia, unspecified: Secondary | ICD-10-CM | POA: Diagnosis not present

## 2018-12-30 DIAGNOSIS — E669 Obesity, unspecified: Secondary | ICD-10-CM | POA: Diagnosis not present

## 2018-12-30 DIAGNOSIS — Z136 Encounter for screening for cardiovascular disorders: Secondary | ICD-10-CM | POA: Diagnosis not present

## 2018-12-30 DIAGNOSIS — N959 Unspecified menopausal and perimenopausal disorder: Secondary | ICD-10-CM | POA: Diagnosis not present

## 2018-12-30 DIAGNOSIS — Z0000 Encounter for general adult medical examination without abnormal findings: Secondary | ICD-10-CM | POA: Diagnosis not present

## 2018-12-30 DIAGNOSIS — Z6837 Body mass index (BMI) 37.0-37.9, adult: Secondary | ICD-10-CM | POA: Diagnosis not present

## 2018-12-30 DIAGNOSIS — Z1331 Encounter for screening for depression: Secondary | ICD-10-CM | POA: Diagnosis not present

## 2018-12-30 DIAGNOSIS — Z9181 History of falling: Secondary | ICD-10-CM | POA: Diagnosis not present

## 2019-01-25 DIAGNOSIS — F321 Major depressive disorder, single episode, moderate: Secondary | ICD-10-CM | POA: Diagnosis not present

## 2019-01-25 DIAGNOSIS — E1165 Type 2 diabetes mellitus with hyperglycemia: Secondary | ICD-10-CM | POA: Diagnosis not present

## 2019-01-25 DIAGNOSIS — I10 Essential (primary) hypertension: Secondary | ICD-10-CM | POA: Diagnosis not present

## 2019-01-25 DIAGNOSIS — Z6837 Body mass index (BMI) 37.0-37.9, adult: Secondary | ICD-10-CM | POA: Diagnosis not present

## 2019-01-25 DIAGNOSIS — Z794 Long term (current) use of insulin: Secondary | ICD-10-CM | POA: Diagnosis not present

## 2019-01-25 DIAGNOSIS — E1122 Type 2 diabetes mellitus with diabetic chronic kidney disease: Secondary | ICD-10-CM | POA: Diagnosis not present

## 2019-01-25 DIAGNOSIS — E785 Hyperlipidemia, unspecified: Secondary | ICD-10-CM | POA: Diagnosis not present

## 2019-01-25 DIAGNOSIS — N183 Chronic kidney disease, stage 3 (moderate): Secondary | ICD-10-CM | POA: Diagnosis not present

## 2019-01-25 DIAGNOSIS — Z23 Encounter for immunization: Secondary | ICD-10-CM | POA: Diagnosis not present

## 2019-01-31 DIAGNOSIS — G8929 Other chronic pain: Secondary | ICD-10-CM | POA: Diagnosis not present

## 2019-01-31 DIAGNOSIS — M1712 Unilateral primary osteoarthritis, left knee: Secondary | ICD-10-CM | POA: Diagnosis not present

## 2019-01-31 DIAGNOSIS — M25562 Pain in left knee: Secondary | ICD-10-CM | POA: Diagnosis not present

## 2019-02-01 DIAGNOSIS — Z794 Long term (current) use of insulin: Secondary | ICD-10-CM | POA: Diagnosis not present

## 2019-02-01 DIAGNOSIS — E1165 Type 2 diabetes mellitus with hyperglycemia: Secondary | ICD-10-CM | POA: Diagnosis not present

## 2019-02-01 DIAGNOSIS — E1122 Type 2 diabetes mellitus with diabetic chronic kidney disease: Secondary | ICD-10-CM | POA: Diagnosis not present

## 2019-02-01 DIAGNOSIS — Z713 Dietary counseling and surveillance: Secondary | ICD-10-CM | POA: Diagnosis not present

## 2019-02-01 DIAGNOSIS — Z6838 Body mass index (BMI) 38.0-38.9, adult: Secondary | ICD-10-CM | POA: Diagnosis not present

## 2019-02-01 DIAGNOSIS — E785 Hyperlipidemia, unspecified: Secondary | ICD-10-CM | POA: Diagnosis not present

## 2019-02-10 DIAGNOSIS — E782 Mixed hyperlipidemia: Secondary | ICD-10-CM | POA: Diagnosis not present

## 2019-02-10 DIAGNOSIS — N2581 Secondary hyperparathyroidism of renal origin: Secondary | ICD-10-CM | POA: Diagnosis not present

## 2019-02-10 DIAGNOSIS — E1165 Type 2 diabetes mellitus with hyperglycemia: Secondary | ICD-10-CM | POA: Diagnosis not present

## 2019-02-10 DIAGNOSIS — E042 Nontoxic multinodular goiter: Secondary | ICD-10-CM | POA: Diagnosis not present

## 2019-03-11 DIAGNOSIS — N183 Chronic kidney disease, stage 3 unspecified: Secondary | ICD-10-CM | POA: Diagnosis not present

## 2019-03-15 DIAGNOSIS — E1165 Type 2 diabetes mellitus with hyperglycemia: Secondary | ICD-10-CM | POA: Diagnosis not present

## 2019-03-15 DIAGNOSIS — N183 Chronic kidney disease, stage 3 unspecified: Secondary | ICD-10-CM | POA: Diagnosis not present

## 2019-03-15 DIAGNOSIS — N1832 Chronic kidney disease, stage 3b: Secondary | ICD-10-CM | POA: Diagnosis not present

## 2019-03-15 DIAGNOSIS — E1122 Type 2 diabetes mellitus with diabetic chronic kidney disease: Secondary | ICD-10-CM | POA: Diagnosis not present

## 2019-03-15 DIAGNOSIS — I129 Hypertensive chronic kidney disease with stage 1 through stage 4 chronic kidney disease, or unspecified chronic kidney disease: Secondary | ICD-10-CM | POA: Diagnosis not present

## 2019-03-28 DIAGNOSIS — J209 Acute bronchitis, unspecified: Secondary | ICD-10-CM | POA: Diagnosis not present

## 2019-03-28 DIAGNOSIS — K219 Gastro-esophageal reflux disease without esophagitis: Secondary | ICD-10-CM | POA: Diagnosis not present

## 2019-04-26 DIAGNOSIS — Z6838 Body mass index (BMI) 38.0-38.9, adult: Secondary | ICD-10-CM | POA: Diagnosis not present

## 2019-04-26 DIAGNOSIS — E1122 Type 2 diabetes mellitus with diabetic chronic kidney disease: Secondary | ICD-10-CM | POA: Diagnosis not present

## 2019-04-26 DIAGNOSIS — E785 Hyperlipidemia, unspecified: Secondary | ICD-10-CM | POA: Diagnosis not present

## 2019-04-26 DIAGNOSIS — N183 Chronic kidney disease, stage 3 unspecified: Secondary | ICD-10-CM | POA: Diagnosis not present

## 2019-04-26 DIAGNOSIS — I10 Essential (primary) hypertension: Secondary | ICD-10-CM | POA: Diagnosis not present

## 2019-04-26 DIAGNOSIS — F321 Major depressive disorder, single episode, moderate: Secondary | ICD-10-CM | POA: Diagnosis not present

## 2019-04-26 DIAGNOSIS — Z794 Long term (current) use of insulin: Secondary | ICD-10-CM | POA: Diagnosis not present

## 2019-04-26 DIAGNOSIS — E1165 Type 2 diabetes mellitus with hyperglycemia: Secondary | ICD-10-CM | POA: Diagnosis not present

## 2019-04-26 DIAGNOSIS — K219 Gastro-esophageal reflux disease without esophagitis: Secondary | ICD-10-CM | POA: Diagnosis not present

## 2019-04-27 DIAGNOSIS — E669 Obesity, unspecified: Secondary | ICD-10-CM | POA: Diagnosis not present

## 2019-04-27 DIAGNOSIS — Z6838 Body mass index (BMI) 38.0-38.9, adult: Secondary | ICD-10-CM | POA: Diagnosis not present

## 2019-04-27 DIAGNOSIS — E1122 Type 2 diabetes mellitus with diabetic chronic kidney disease: Secondary | ICD-10-CM | POA: Diagnosis not present

## 2019-04-27 DIAGNOSIS — N183 Chronic kidney disease, stage 3 unspecified: Secondary | ICD-10-CM | POA: Diagnosis not present

## 2019-05-18 DIAGNOSIS — E782 Mixed hyperlipidemia: Secondary | ICD-10-CM | POA: Diagnosis not present

## 2019-05-18 DIAGNOSIS — N2581 Secondary hyperparathyroidism of renal origin: Secondary | ICD-10-CM | POA: Diagnosis not present

## 2019-05-18 DIAGNOSIS — E1165 Type 2 diabetes mellitus with hyperglycemia: Secondary | ICD-10-CM | POA: Diagnosis not present

## 2019-05-18 DIAGNOSIS — E042 Nontoxic multinodular goiter: Secondary | ICD-10-CM | POA: Diagnosis not present

## 2019-05-23 DIAGNOSIS — M25562 Pain in left knee: Secondary | ICD-10-CM | POA: Diagnosis not present

## 2019-05-23 DIAGNOSIS — M1712 Unilateral primary osteoarthritis, left knee: Secondary | ICD-10-CM | POA: Diagnosis not present

## 2019-06-15 DIAGNOSIS — M1712 Unilateral primary osteoarthritis, left knee: Secondary | ICD-10-CM | POA: Diagnosis not present

## 2019-06-23 DIAGNOSIS — M25562 Pain in left knee: Secondary | ICD-10-CM | POA: Diagnosis not present

## 2019-06-29 DIAGNOSIS — M1712 Unilateral primary osteoarthritis, left knee: Secondary | ICD-10-CM | POA: Diagnosis not present

## 2019-07-01 DIAGNOSIS — N1832 Chronic kidney disease, stage 3b: Secondary | ICD-10-CM | POA: Diagnosis not present

## 2019-07-05 DIAGNOSIS — D638 Anemia in other chronic diseases classified elsewhere: Secondary | ICD-10-CM | POA: Diagnosis not present

## 2019-07-05 DIAGNOSIS — D72829 Elevated white blood cell count, unspecified: Secondary | ICD-10-CM | POA: Diagnosis not present

## 2019-07-05 DIAGNOSIS — N183 Chronic kidney disease, stage 3 unspecified: Secondary | ICD-10-CM | POA: Diagnosis not present

## 2019-07-05 DIAGNOSIS — Z794 Long term (current) use of insulin: Secondary | ICD-10-CM | POA: Diagnosis not present

## 2019-07-05 DIAGNOSIS — E1165 Type 2 diabetes mellitus with hyperglycemia: Secondary | ICD-10-CM | POA: Diagnosis not present

## 2019-07-05 DIAGNOSIS — E1122 Type 2 diabetes mellitus with diabetic chronic kidney disease: Secondary | ICD-10-CM | POA: Diagnosis not present

## 2019-07-05 DIAGNOSIS — Z01818 Encounter for other preprocedural examination: Secondary | ICD-10-CM | POA: Diagnosis not present

## 2019-07-05 DIAGNOSIS — I10 Essential (primary) hypertension: Secondary | ICD-10-CM | POA: Diagnosis not present

## 2019-07-05 DIAGNOSIS — Z6838 Body mass index (BMI) 38.0-38.9, adult: Secondary | ICD-10-CM | POA: Diagnosis not present

## 2019-07-13 DIAGNOSIS — N183 Chronic kidney disease, stage 3 unspecified: Secondary | ICD-10-CM | POA: Diagnosis not present

## 2019-07-13 DIAGNOSIS — E1122 Type 2 diabetes mellitus with diabetic chronic kidney disease: Secondary | ICD-10-CM | POA: Diagnosis not present

## 2019-07-13 DIAGNOSIS — N2581 Secondary hyperparathyroidism of renal origin: Secondary | ICD-10-CM | POA: Diagnosis not present

## 2019-07-13 DIAGNOSIS — N1832 Chronic kidney disease, stage 3b: Secondary | ICD-10-CM | POA: Diagnosis not present

## 2019-07-13 DIAGNOSIS — I129 Hypertensive chronic kidney disease with stage 1 through stage 4 chronic kidney disease, or unspecified chronic kidney disease: Secondary | ICD-10-CM | POA: Diagnosis not present

## 2019-07-13 DIAGNOSIS — E1165 Type 2 diabetes mellitus with hyperglycemia: Secondary | ICD-10-CM | POA: Diagnosis not present

## 2019-07-18 DIAGNOSIS — D72829 Elevated white blood cell count, unspecified: Secondary | ICD-10-CM | POA: Diagnosis not present

## 2019-07-18 DIAGNOSIS — E1122 Type 2 diabetes mellitus with diabetic chronic kidney disease: Secondary | ICD-10-CM | POA: Diagnosis not present

## 2019-07-22 ENCOUNTER — Telehealth: Payer: Self-pay

## 2019-07-22 NOTE — Telephone Encounter (Signed)
   [facility] Pre-operative Risk Assessment    Request for surgical clearance:  1. What type of surgery is being performed? Left Knee Arthroscopy  2. When is this surgery scheduled? TBD   3. What type of clearance is required (medical clearance vs. Pharmacy clearance to hold med vs. Both)? Medical/Pharmacy  4. Are there any medications that need to be held prior to surgery and how long?  None specified  5. Practice name and name of physician performing surgery? [facility]. Unknown physician.   6. What is your office phone number [phone]    7.   What is your office fax number [phone]  8.   Anesthesia type (None, local, MAC, general) ? General   Mary Norris 07/22/2019, 9:01 AM  _________________________________________________________________   (provider comments below)

## 2019-07-22 NOTE — Telephone Encounter (Signed)
Pt has a new patient appt on 4/5 with Dr. Dulce Sellar for surgical clearance. I will route this clearance request to Dr. Dulce Sellar.

## 2019-07-29 ENCOUNTER — Encounter: Payer: Self-pay | Admitting: Cardiology

## 2019-07-29 DIAGNOSIS — G4700 Insomnia, unspecified: Secondary | ICD-10-CM

## 2019-07-29 DIAGNOSIS — E785 Hyperlipidemia, unspecified: Secondary | ICD-10-CM | POA: Insufficient documentation

## 2019-07-29 DIAGNOSIS — D638 Anemia in other chronic diseases classified elsewhere: Secondary | ICD-10-CM | POA: Insufficient documentation

## 2019-07-29 DIAGNOSIS — E669 Obesity, unspecified: Secondary | ICD-10-CM

## 2019-07-29 DIAGNOSIS — F321 Major depressive disorder, single episode, moderate: Secondary | ICD-10-CM | POA: Insufficient documentation

## 2019-07-29 DIAGNOSIS — J309 Allergic rhinitis, unspecified: Secondary | ICD-10-CM

## 2019-07-29 DIAGNOSIS — G44229 Chronic tension-type headache, not intractable: Secondary | ICD-10-CM

## 2019-07-29 DIAGNOSIS — E875 Hyperkalemia: Secondary | ICD-10-CM | POA: Insufficient documentation

## 2019-07-29 DIAGNOSIS — M1990 Unspecified osteoarthritis, unspecified site: Secondary | ICD-10-CM

## 2019-07-29 DIAGNOSIS — K219 Gastro-esophageal reflux disease without esophagitis: Secondary | ICD-10-CM | POA: Insufficient documentation

## 2019-07-29 DIAGNOSIS — E7800 Pure hypercholesterolemia, unspecified: Secondary | ICD-10-CM | POA: Insufficient documentation

## 2019-07-29 HISTORY — DX: Major depressive disorder, single episode, moderate: F32.1

## 2019-07-29 HISTORY — DX: Allergic rhinitis, unspecified: J30.9

## 2019-07-29 HISTORY — DX: Hyperlipidemia, unspecified: E78.5

## 2019-07-29 HISTORY — DX: Insomnia, unspecified: G47.00

## 2019-07-29 HISTORY — DX: Obesity, unspecified: E66.9

## 2019-07-29 HISTORY — DX: Anemia in other chronic diseases classified elsewhere: D63.8

## 2019-07-29 HISTORY — DX: Chronic tension-type headache, not intractable: G44.229

## 2019-07-29 HISTORY — DX: Hyperkalemia: E87.5

## 2019-07-29 HISTORY — DX: Pure hypercholesterolemia, unspecified: E78.00

## 2019-07-29 HISTORY — DX: Unspecified osteoarthritis, unspecified site: M19.90

## 2019-07-31 NOTE — Progress Notes (Signed)
Cardiology Office Note:    Date:  08/01/2019   ID:  Mary Norris, DOB [birth year], MRN [id number]  PCP:  Hurshel Party, NP   Cardiologist:  Norman Herrlich, MD   Referring MD: Hurshel Party, NP  ASSESSMENT:    1. Preoperative cardiovascular examination   2. Essential hypertension   3. Stage 3 chronic kidney disease, unspecified whether stage 3a or 3b CKD   4. Type 2 diabetes mellitus with hyperglycemia, with long-term current use of insulin (HCC)   5. Hypercholesterolemia    PLAN:    In order of problems listed above:  1. Advised her planned arthroscopic knee surgery elective outpatient and requires no further preoperative evaluation.  Aspirin could be stopped 5 to 7 days before surgery if needed please instruct the patient when to resume usually 1 to 2 days afterwards.  She anticipates outpatient surgery 2. Stable 3. Managed by her PCP 4. We will continue her statin  Next appointment as needed   Medication Adjustments/Labs and Tests Ordered: Current medicines are reviewed at length with the patient today.  Concerns regarding medicines are outlined above.  Orders Placed This Encounter  Procedures  . EKG 12-Lead   No orders of the defined types were placed in this encounter.    Chief Complaint  Patient presents with  . Pre-op Exam    History of Present Illness:    Mary Norris is a 74 y.o. female who is being seen today for preoperative evaluation  at the request of Moon, Amy A, NP. She is pending arthroscopic knee surgery general anesthesia  Recent labs show creatinine 1.29 BUN 31 GFR 41 cc/min potassium mildly elevated at 5.3 normal liver function test  She tells me had seen her at some point in the early 2000's and showed a normal stress test.  She has no history of heart disease congenital rheumatic heart failure atrial fibrillation has had no chest pain shortness of breath palpitation or syncope.  She is planned for arthroscopic surgery outpatient.  She has  had no adverse reactions to anesthetic agents and no history of deep vein thrombosis.  She takes low-dose aspirin for general cardiovascular prophylaxis.  Her hypertension is stable look at her EKG before leaving the office 903 she requires any further preoperative cardiac evaluation. Past Medical History:  Diagnosis Date  . Allergy   . Anemia, chronic disease 07/29/2019  . ARF (acute renal failure) (HCC) 07/17/2011  . Arthritis   . Benign hypertension with chronic kidney disease, stage III 07/19/2011  . Chicken pox   . Chronic renal failure 07/19/2011  . CKD (chronic kidney disease) stage 3, GFR 30-59 ml/min 05/14/2016  . Current moderate episode of major depressive disorder (HCC) 07/29/2019  . Depression   . Diabetes mellitus   . Dyslipidemia 07/29/2019  . Essential hypertension 07/19/2011  . First degree AV block 07/19/2011  . Generalized obesity 07/29/2019  . Headache(784.0)    migraines  . History of colon polyps   . HTN (hypertension) 07/19/2011  . Hypercholesterolemia 07/29/2019  . Hyperlipidemia   . Hyperlipidemia associated with type 2 diabetes mellitus (HCC) 12/10/2015  . Hyperpotassemia 07/29/2019  . Hypertension   . Increased frequency of urination 06/10/2017   Formatting of this note might be different from the original. Seems out of proportion to what would be expected for her glycemic control  . Insomnia, unspecified 07/29/2019  . Low back pain 12/06/2012  . Microalbuminuria due to type 2 diabetes mellitus (HCC) 02/12/2016  . Migraine  12/10/2015  . Multinodular goiter 09/15/2011   Too small to need bx   . Secondary hyperparathyroidism of renal origin ([place]) 06/23/2017  . Severe obesity (BMI 35.0-39.9) with comorbidity ([place]) 02/12/2016  . Type 2 diabetes mellitus with hyperglycemia, with long-term current use of insulin ([place]) 02/12/2016  . 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 Uncontrolled type 2 diabetes mellitus with chronic kidney disease ([place])  10/08/2011  . Uncontrolled type II diabetes with stage 3 chronic kidney disease ([place]) 06/23/2017    Past Surgical History:  Procedure Laterality Date  . ABDOMINAL HYSTERECTOMY    . APPENDECTOMY  1963  . BREAST BIOPSY  1999  . CATARACT EXTRACTION, BILATERAL Bilateral 06/13/2014  . CHOLECYSTECTOMY    . ROTATOR CUFF REPAIR Right 2006  . TUBAL LIGATION      Current Medications: Current Meds  Medication Sig  . albuterol (VENTOLIN HFA) 108 (90 Base) MCG/ACT inhaler Inhale 2 puffs into the lungs as needed. Every 4-6 hours  . amitriptyline (ELAVIL) 50 MG tablet Take 50 mg by mouth daily as needed for sleep.  Marland Kitchen amLODipine (NORVASC) 10 MG tablet Take 10 mg by mouth daily.  Marland Kitchen aspirin 81 MG EC tablet Take 1 tablet by mouth daily.  . Calcium Carb-Cholecalciferol 600-800 MG-UNIT CHEW Chew 1 tablet by mouth daily.  . Cholecalciferol 25 MCG (1000 UT) tablet Take 1,000 Units by mouth daily.  . citalopram (CELEXA) 40 MG tablet Take 40 mg by mouth daily.  . dapagliflozin propanediol (FARXIGA) 5 MG TABS tablet Take 5 mg by mouth daily.  Marland Kitchen dicyclomine (BENTYL) 10 MG capsule Take 10 mg by mouth every 6 (six) hours as needed for pain.  . Ferrous Gluconate 324 (37.5 Fe) MG TABS Take 1 tablet by mouth daily.  . fluticasone (FLONASE) 50 MCG/ACT nasal spray Place 2 sprays into both nostrils daily.  . hydrochlorothiazide (MICROZIDE) 12.5 MG capsule Take 12.5 mg by mouth daily.  . insulin isophane & regular human (HUMULIN 70/30 MIX) (70-30) 100 UNIT/ML KwikPen Inject 45 mLs into the skin daily with breakfast. And 40 mL with supper  . lisinopril (ZESTRIL) 10 MG tablet Take 10 mg by mouth daily.  . Multiple Vitamin (MULTIVITAMIN) capsule Take 1 capsule by mouth daily.  . Multiple Vitamins-Minerals (MULTIVITAMIN WITH MINERALS) tablet Take 1 tablet by mouth daily.  . Omega-3 Fatty Acids (FISH OIL) 1000 MG CPDR Take 1 capsule by mouth 2 (two) times daily.  . pantoprazole (PROTONIX) 40 MG tablet Take 1 tablet by mouth  daily.  . pravastatin (PRAVACHOL) 40 MG tablet Take 40 mg by mouth daily.  . propranolol (INDERAL) 40 MG tablet Take 1 tablet (40 mg total) by mouth 2 (two) times daily.  . traMADol (ULTRAM) 50 MG tablet Take 1 tablet by mouth every 6 (six) hours as needed. Patient may take up to 2 every 6 hours prn.  Marland Kitchen VASCEPA 1 g capsule Take 2 capsules by mouth 2 (two) times daily.  . [DISCONTINUED] dicyclomine (BENTYL) 10 MG capsule Take 10 mg by mouth 4 (four) times daily -  before meals and at bedtime.     Allergies:   Codeine, Lipitor [atorvastatin], Morphine and related, Morphine sulfate, and Zocor [simvastatin]   Social History   Socioeconomic History  . Marital status: Married    Spouse name: Not on file  . Number of children: 2  . Years of education: 67  . Highest education level: Not on file  Occupational History  . Occupation: Retired  Tobacco  Use  . Smoking status: Never Smoker  . Smokeless tobacco: Never Used  Substance and Sexual Activity  . Alcohol use: No  . Drug use: No  . Sexual activity: Never  Other Topics Concern  . Not on file  Social History Narrative  . Not on file   Social Determinants of Health   Financial Resource Strain:   . Difficulty of Paying Living Expenses:   Food Insecurity:   . Worried About Programme researcher, broadcasting/film/video in the Last Year:   . Barista in the Last Year:   Transportation Needs:   . Freight forwarder (Medical):   Marland Kitchen Lack of Transportation (Non-Medical):   Physical Activity:   . Days of Exercise per Week:   . Minutes of Exercise per Session:   Stress:   . Feeling of Stress :   Social Connections:   . Frequency of Communication with Friends and Family:   . Frequency of Social Gatherings with Friends and Family:   . Attends Religious Services:   . Active Member of Clubs or Organizations:   . Attends Banker Meetings:   Marland Kitchen Marital Status:      Family History: The patient's family history includes Breast cancer in her  mother; CAD in her brother and father; Cancer in her father and another family member; Diabetes Mellitus I in her maternal grandmother; Heart attack in her father; Heart disease in her brother and father; Hypertension in her brother, father, mother, and another family member; Parkinson's disease in her mother; Stroke in her father.  ROS:   Review of Systems  Constitution: Negative.  Cardiovascular: Negative.   Respiratory: Negative.   Musculoskeletal: Positive for joint pain.   Please see the history of present illness.     All other systems reviewed and are negative.  EKGs/Labs/Other Studies Reviewed:    The following studies were reviewed today:   EKG:  EKG is  ordered today.  The ekg ordered today is personally reviewed and demonstrates this rhythm normal  Recent Labs: 04/26/2019: Cholesterol 209 LDL 108 HDL 39 triglycerides 362 taking high potency fish oil EPA and a statin.  Lipids at target Creatinine stable 1.29 she has CKD Diabetes poorly controlled A1c 8.9%   Physical Exam:    VS:  BP 134/74   Pulse 72   Temp 97.9 F (36.6 C)   Ht 5\' 2"  (1.575 m)   Wt 214 lb 3.2 oz (97.2 kg)   SpO2 97%   BMI 39.18 kg/m     Wt Readings from Last 3 Encounters:  08/01/19 214 lb 3.2 oz (97.2 kg)  06/14/14 193 lb (87.5 kg)  02/13/14 194 lb (88 kg)     GEN:  Well nourished, well developed in no acute distress HEENT: Normal NECK: No JVD; No carotid bruits LYMPHATICS: No lymphadenopathy CARDIAC: RRR, no murmurs, rubs, gallops RESPIRATORY:  Clear to auscultation without rales, wheezing or rhonchi  ABDOMEN: Soft, non-tender, non-distended MUSCULOSKELETAL:  No edema; No deformity  SKIN: Warm and dry NEUROLOGIC:  Alert and oriented x 3 PSYCHIATRIC:  Normal affect     Signed, 02/15/14, MD  08/01/2019 4:17 PM    [facility]

## 2019-08-01 ENCOUNTER — Encounter: Payer: Self-pay | Admitting: Cardiology

## 2019-08-01 ENCOUNTER — Other Ambulatory Visit: Payer: Self-pay

## 2019-08-01 ENCOUNTER — Ambulatory Visit (INDEPENDENT_AMBULATORY_CARE_PROVIDER_SITE_OTHER): Payer: Medicare PPO | Admitting: Cardiology

## 2019-08-01 VITALS — BP 134/74 | HR 72 | Temp 97.9°F | Ht 62.0 in | Wt 214.2 lb

## 2019-08-01 DIAGNOSIS — E7800 Pure hypercholesterolemia, unspecified: Secondary | ICD-10-CM | POA: Diagnosis not present

## 2019-08-01 DIAGNOSIS — I10 Essential (primary) hypertension: Secondary | ICD-10-CM | POA: Diagnosis not present

## 2019-08-01 DIAGNOSIS — Z01810 Encounter for preprocedural cardiovascular examination: Secondary | ICD-10-CM | POA: Diagnosis not present

## 2019-08-01 DIAGNOSIS — N183 Chronic kidney disease, stage 3 unspecified: Secondary | ICD-10-CM

## 2019-08-01 DIAGNOSIS — E1165 Type 2 diabetes mellitus with hyperglycemia: Secondary | ICD-10-CM | POA: Diagnosis not present

## 2019-08-01 DIAGNOSIS — Z794 Long term (current) use of insulin: Secondary | ICD-10-CM | POA: Diagnosis not present

## 2019-08-01 NOTE — Patient Instructions (Signed)

## 2019-08-17 ENCOUNTER — Telehealth: Payer: Self-pay

## 2019-08-17 DIAGNOSIS — R809 Proteinuria, unspecified: Secondary | ICD-10-CM | POA: Diagnosis not present

## 2019-08-17 DIAGNOSIS — N2581 Secondary hyperparathyroidism of renal origin: Secondary | ICD-10-CM | POA: Diagnosis not present

## 2019-08-17 DIAGNOSIS — E782 Mixed hyperlipidemia: Secondary | ICD-10-CM | POA: Diagnosis not present

## 2019-08-17 DIAGNOSIS — E1165 Type 2 diabetes mellitus with hyperglycemia: Secondary | ICD-10-CM | POA: Diagnosis not present

## 2019-08-17 NOTE — Telephone Encounter (Signed)
   [facility] Pre-operative Risk Assessment    Request for surgical clearance:  1. What type of surgery is being performed? Left Knee Arthroscopy   2. When is this surgery scheduled? TBD   3. What type of clearance is required (medical clearance vs. Pharmacy clearance to hold med vs. Both)? Medical/Pharmacy  4. Are there any medications that need to be held prior to surgery and how long? None noted   5. Practice name and name of physician performing surgery? [facility].    6. What is your office phone number [phone]    7.   What is your office fax number [phone]  8.   Anesthesia type (None, local, MAC, general) ? General   Gita Kudo 08/17/2019, 2:34 PM  _________________________________________________________________   (provider comments below)

## 2019-08-17 NOTE — Telephone Encounter (Signed)
   Primary Cardiologist: Gypsy Balsam, MD  Chart reviewed as part of pre-operative protocol coverage. Given past medical history and time since last visit, based on ACC/AHA guidelines, Mary Norris would be at acceptable risk for the planned procedure without further cardiovascular testing.   She may hold her aspirin for 5-7 days prior to surgery if needed and resume 1-2 days after surgery.  I will route this recommendation to the requesting party via Epic fax function and remove from pre-op pool.  Please call with questions.  Thomasene Ripple. Jadis Mika NP-C       [facility] [address] Office [phone] Fax [phone]

## 2019-09-05 DIAGNOSIS — Z1152 Encounter for screening for COVID-19: Secondary | ICD-10-CM | POA: Diagnosis not present

## 2019-09-05 DIAGNOSIS — Z1159 Encounter for screening for other viral diseases: Secondary | ICD-10-CM | POA: Diagnosis not present

## 2019-09-08 DIAGNOSIS — S83232A Complex tear of medial meniscus, current injury, left knee, initial encounter: Secondary | ICD-10-CM | POA: Diagnosis not present

## 2019-09-08 DIAGNOSIS — M94262 Chondromalacia, left knee: Secondary | ICD-10-CM | POA: Diagnosis not present

## 2019-09-08 DIAGNOSIS — D638 Anemia in other chronic diseases classified elsewhere: Secondary | ICD-10-CM | POA: Diagnosis not present

## 2019-09-08 DIAGNOSIS — Z794 Long term (current) use of insulin: Secondary | ICD-10-CM | POA: Diagnosis not present

## 2019-09-08 DIAGNOSIS — M25562 Pain in left knee: Secondary | ICD-10-CM | POA: Diagnosis not present

## 2019-09-08 DIAGNOSIS — Z7982 Long term (current) use of aspirin: Secondary | ICD-10-CM | POA: Diagnosis not present

## 2019-09-08 DIAGNOSIS — M1712 Unilateral primary osteoarthritis, left knee: Secondary | ICD-10-CM | POA: Diagnosis not present

## 2019-09-08 DIAGNOSIS — M23322 Other meniscus derangements, posterior horn of medial meniscus, left knee: Secondary | ICD-10-CM | POA: Diagnosis not present

## 2019-09-08 DIAGNOSIS — S83282A Other tear of lateral meniscus, current injury, left knee, initial encounter: Secondary | ICD-10-CM | POA: Diagnosis not present

## 2019-09-08 DIAGNOSIS — E119 Type 2 diabetes mellitus without complications: Secondary | ICD-10-CM | POA: Diagnosis not present

## 2019-09-08 DIAGNOSIS — M659 Synovitis and tenosynovitis, unspecified: Secondary | ICD-10-CM | POA: Diagnosis not present

## 2019-09-08 DIAGNOSIS — G8929 Other chronic pain: Secondary | ICD-10-CM | POA: Diagnosis not present

## 2019-09-08 DIAGNOSIS — M23342 Other meniscus derangements, anterior horn of lateral meniscus, left knee: Secondary | ICD-10-CM | POA: Diagnosis not present

## 2019-09-08 DIAGNOSIS — I10 Essential (primary) hypertension: Secondary | ICD-10-CM | POA: Diagnosis not present

## 2019-09-12 DIAGNOSIS — M25662 Stiffness of left knee, not elsewhere classified: Secondary | ICD-10-CM | POA: Diagnosis not present

## 2019-09-12 DIAGNOSIS — M6281 Muscle weakness (generalized): Secondary | ICD-10-CM | POA: Diagnosis not present

## 2019-09-12 DIAGNOSIS — M25562 Pain in left knee: Secondary | ICD-10-CM | POA: Diagnosis not present

## 2019-09-12 DIAGNOSIS — R2689 Other abnormalities of gait and mobility: Secondary | ICD-10-CM | POA: Diagnosis not present

## 2019-09-19 DIAGNOSIS — E119 Type 2 diabetes mellitus without complications: Secondary | ICD-10-CM | POA: Diagnosis not present

## 2019-10-24 DIAGNOSIS — M1712 Unilateral primary osteoarthritis, left knee: Secondary | ICD-10-CM | POA: Diagnosis not present

## 2019-10-27 HISTORY — PX: OTHER SURGICAL HISTORY: SHX169

## 2019-11-11 DIAGNOSIS — N1832 Chronic kidney disease, stage 3b: Secondary | ICD-10-CM | POA: Diagnosis not present

## 2019-11-11 DIAGNOSIS — Z1231 Encounter for screening mammogram for malignant neoplasm of breast: Secondary | ICD-10-CM | POA: Diagnosis not present

## 2019-11-17 DIAGNOSIS — F321 Major depressive disorder, single episode, moderate: Secondary | ICD-10-CM | POA: Diagnosis not present

## 2019-11-17 DIAGNOSIS — D638 Anemia in other chronic diseases classified elsewhere: Secondary | ICD-10-CM | POA: Diagnosis not present

## 2019-11-17 DIAGNOSIS — N183 Chronic kidney disease, stage 3 unspecified: Secondary | ICD-10-CM | POA: Diagnosis not present

## 2019-11-17 DIAGNOSIS — E1122 Type 2 diabetes mellitus with diabetic chronic kidney disease: Secondary | ICD-10-CM | POA: Diagnosis not present

## 2019-11-17 DIAGNOSIS — I10 Essential (primary) hypertension: Secondary | ICD-10-CM | POA: Diagnosis not present

## 2019-11-17 DIAGNOSIS — Z6839 Body mass index (BMI) 39.0-39.9, adult: Secondary | ICD-10-CM | POA: Diagnosis not present

## 2019-11-17 DIAGNOSIS — E785 Hyperlipidemia, unspecified: Secondary | ICD-10-CM | POA: Diagnosis not present

## 2019-11-17 DIAGNOSIS — Z794 Long term (current) use of insulin: Secondary | ICD-10-CM | POA: Diagnosis not present

## 2019-11-17 DIAGNOSIS — E1165 Type 2 diabetes mellitus with hyperglycemia: Secondary | ICD-10-CM | POA: Diagnosis not present

## 2019-12-07 DIAGNOSIS — S83242A Other tear of medial meniscus, current injury, left knee, initial encounter: Secondary | ICD-10-CM | POA: Diagnosis not present

## 2019-12-07 DIAGNOSIS — M1712 Unilateral primary osteoarthritis, left knee: Secondary | ICD-10-CM | POA: Diagnosis not present

## 2019-12-07 DIAGNOSIS — E1165 Type 2 diabetes mellitus with hyperglycemia: Secondary | ICD-10-CM | POA: Diagnosis not present

## 2019-12-07 DIAGNOSIS — N2581 Secondary hyperparathyroidism of renal origin: Secondary | ICD-10-CM | POA: Diagnosis not present

## 2019-12-07 DIAGNOSIS — N183 Chronic kidney disease, stage 3 unspecified: Secondary | ICD-10-CM | POA: Diagnosis not present

## 2019-12-07 DIAGNOSIS — R809 Proteinuria, unspecified: Secondary | ICD-10-CM | POA: Diagnosis not present

## 2019-12-09 DIAGNOSIS — N1832 Chronic kidney disease, stage 3b: Secondary | ICD-10-CM | POA: Diagnosis not present

## 2019-12-20 DIAGNOSIS — G8929 Other chronic pain: Secondary | ICD-10-CM | POA: Diagnosis not present

## 2019-12-20 DIAGNOSIS — Z6839 Body mass index (BMI) 39.0-39.9, adult: Secondary | ICD-10-CM | POA: Diagnosis not present

## 2019-12-20 DIAGNOSIS — N183 Chronic kidney disease, stage 3 unspecified: Secondary | ICD-10-CM | POA: Diagnosis not present

## 2019-12-20 DIAGNOSIS — E1165 Type 2 diabetes mellitus with hyperglycemia: Secondary | ICD-10-CM | POA: Diagnosis not present

## 2019-12-20 DIAGNOSIS — E1122 Type 2 diabetes mellitus with diabetic chronic kidney disease: Secondary | ICD-10-CM | POA: Diagnosis not present

## 2019-12-20 DIAGNOSIS — N1832 Chronic kidney disease, stage 3b: Secondary | ICD-10-CM | POA: Diagnosis not present

## 2019-12-20 DIAGNOSIS — Z794 Long term (current) use of insulin: Secondary | ICD-10-CM | POA: Diagnosis not present

## 2019-12-20 DIAGNOSIS — I129 Hypertensive chronic kidney disease with stage 1 through stage 4 chronic kidney disease, or unspecified chronic kidney disease: Secondary | ICD-10-CM | POA: Diagnosis not present

## 2019-12-20 DIAGNOSIS — M25562 Pain in left knee: Secondary | ICD-10-CM | POA: Diagnosis not present

## 2019-12-20 DIAGNOSIS — E538 Deficiency of other specified B group vitamins: Secondary | ICD-10-CM | POA: Diagnosis not present

## 2019-12-20 DIAGNOSIS — Z6841 Body Mass Index (BMI) 40.0 and over, adult: Secondary | ICD-10-CM | POA: Diagnosis not present

## 2019-12-28 DIAGNOSIS — M1712 Unilateral primary osteoarthritis, left knee: Secondary | ICD-10-CM | POA: Diagnosis not present

## 2020-01-19 DIAGNOSIS — Z0000 Encounter for general adult medical examination without abnormal findings: Secondary | ICD-10-CM | POA: Diagnosis not present

## 2020-01-19 DIAGNOSIS — Z9181 History of falling: Secondary | ICD-10-CM | POA: Diagnosis not present

## 2020-01-19 DIAGNOSIS — Z139 Encounter for screening, unspecified: Secondary | ICD-10-CM | POA: Diagnosis not present

## 2020-01-19 DIAGNOSIS — Z1331 Encounter for screening for depression: Secondary | ICD-10-CM | POA: Diagnosis not present

## 2020-01-19 DIAGNOSIS — E785 Hyperlipidemia, unspecified: Secondary | ICD-10-CM | POA: Diagnosis not present

## 2020-01-19 DIAGNOSIS — E669 Obesity, unspecified: Secondary | ICD-10-CM | POA: Diagnosis not present

## 2020-02-09 DIAGNOSIS — N1832 Chronic kidney disease, stage 3b: Secondary | ICD-10-CM | POA: Diagnosis not present

## 2020-02-17 DIAGNOSIS — Z794 Long term (current) use of insulin: Secondary | ICD-10-CM | POA: Diagnosis not present

## 2020-02-17 DIAGNOSIS — F321 Major depressive disorder, single episode, moderate: Secondary | ICD-10-CM | POA: Diagnosis not present

## 2020-02-17 DIAGNOSIS — Z6839 Body mass index (BMI) 39.0-39.9, adult: Secondary | ICD-10-CM | POA: Diagnosis not present

## 2020-02-17 DIAGNOSIS — E1122 Type 2 diabetes mellitus with diabetic chronic kidney disease: Secondary | ICD-10-CM | POA: Diagnosis not present

## 2020-02-17 DIAGNOSIS — E785 Hyperlipidemia, unspecified: Secondary | ICD-10-CM | POA: Diagnosis not present

## 2020-02-17 DIAGNOSIS — N183 Chronic kidney disease, stage 3 unspecified: Secondary | ICD-10-CM | POA: Diagnosis not present

## 2020-02-17 DIAGNOSIS — D638 Anemia in other chronic diseases classified elsewhere: Secondary | ICD-10-CM | POA: Diagnosis not present

## 2020-02-17 DIAGNOSIS — E1165 Type 2 diabetes mellitus with hyperglycemia: Secondary | ICD-10-CM | POA: Diagnosis not present

## 2020-02-17 DIAGNOSIS — I10 Essential (primary) hypertension: Secondary | ICD-10-CM | POA: Diagnosis not present

## 2020-02-22 DIAGNOSIS — M1712 Unilateral primary osteoarthritis, left knee: Secondary | ICD-10-CM | POA: Diagnosis not present

## 2020-02-23 DIAGNOSIS — M2352 Chronic instability of knee, left knee: Secondary | ICD-10-CM | POA: Diagnosis not present

## 2020-02-23 DIAGNOSIS — M1712 Unilateral primary osteoarthritis, left knee: Secondary | ICD-10-CM | POA: Diagnosis not present

## 2020-02-24 DIAGNOSIS — N959 Unspecified menopausal and perimenopausal disorder: Secondary | ICD-10-CM | POA: Diagnosis not present

## 2020-03-14 DIAGNOSIS — Z6841 Body Mass Index (BMI) 40.0 and over, adult: Secondary | ICD-10-CM | POA: Diagnosis not present

## 2020-03-14 DIAGNOSIS — I129 Hypertensive chronic kidney disease with stage 1 through stage 4 chronic kidney disease, or unspecified chronic kidney disease: Secondary | ICD-10-CM | POA: Diagnosis not present

## 2020-03-14 DIAGNOSIS — E042 Nontoxic multinodular goiter: Secondary | ICD-10-CM | POA: Diagnosis not present

## 2020-03-14 DIAGNOSIS — N183 Chronic kidney disease, stage 3 unspecified: Secondary | ICD-10-CM | POA: Diagnosis not present

## 2020-03-14 DIAGNOSIS — E1122 Type 2 diabetes mellitus with diabetic chronic kidney disease: Secondary | ICD-10-CM | POA: Diagnosis not present

## 2020-03-14 DIAGNOSIS — N184 Chronic kidney disease, stage 4 (severe): Secondary | ICD-10-CM | POA: Diagnosis not present

## 2020-03-14 DIAGNOSIS — E1165 Type 2 diabetes mellitus with hyperglycemia: Secondary | ICD-10-CM | POA: Diagnosis not present

## 2020-03-14 DIAGNOSIS — N2581 Secondary hyperparathyroidism of renal origin: Secondary | ICD-10-CM | POA: Diagnosis not present

## 2020-03-14 DIAGNOSIS — Z794 Long term (current) use of insulin: Secondary | ICD-10-CM | POA: Diagnosis not present

## 2020-03-14 DIAGNOSIS — R809 Proteinuria, unspecified: Secondary | ICD-10-CM | POA: Diagnosis not present

## 2020-03-26 DIAGNOSIS — E1122 Type 2 diabetes mellitus with diabetic chronic kidney disease: Secondary | ICD-10-CM | POA: Diagnosis not present

## 2020-04-27 DIAGNOSIS — Z20828 Contact with and (suspected) exposure to other viral communicable diseases: Secondary | ICD-10-CM | POA: Diagnosis not present

## 2020-04-30 DIAGNOSIS — J069 Acute upper respiratory infection, unspecified: Secondary | ICD-10-CM | POA: Diagnosis not present

## 2020-05-22 ENCOUNTER — Telehealth: Payer: Self-pay | Admitting: Oncology

## 2020-05-22 NOTE — Telephone Encounter (Signed)
Patient referred by Gaye Alken, NP for Leukocytosis.  Appt made 05/29/2020 Lab 10:30 am - Cons 11:00 am

## 2020-05-25 DIAGNOSIS — M1712 Unilateral primary osteoarthritis, left knee: Secondary | ICD-10-CM | POA: Diagnosis not present

## 2020-05-28 ENCOUNTER — Other Ambulatory Visit: Payer: Self-pay | Admitting: Oncology

## 2020-05-28 DIAGNOSIS — D72829 Elevated white blood cell count, unspecified: Secondary | ICD-10-CM

## 2020-05-28 NOTE — Progress Notes (Signed)
[facility]  [address] [place],  [place]  [address] [phone]  Clinic Day:  05/29/2020  Referring physician: Hurshel Party, NP   HISTORY OF PRESENT ILLNESS:  The patient is a 75 y.o. female  who I was asked to consult upon for leukocytosis.  Routine labs showed an elevated white count of 14.1. According to the patient, she denies having any recent illnesses or infections that could have precipitated her leukocytosis.  Of note, she received a steroid injection in her knee last week.  She also had one given in October 2022.  She denies having any B symptoms which concern her for her leukocytosis being due to an underlying hematologic malignancy.  She denies having undergone a splenectomy, which can lead to chronic leukocytosis.   Overall, she denies there being any significant changes in her health over these past few months.    PAST MEDICAL HISTORY:   Past Medical History:  Diagnosis Date  . Allergy   . Anemia, chronic disease 07/29/2019  . Anxiety   . ARF (acute renal failure) (HCC) 07/17/2011  . Arthritis   . Benign hypertension with chronic kidney disease, stage III (HCC) 07/19/2011  . Chicken pox   . Chronic renal failure 07/19/2011  . CKD (chronic kidney disease) stage 3, GFR 30-59 ml/min (HCC) 05/14/2016  . Current moderate episode of major depressive disorder (HCC) 07/29/2019  . Depression   . Diabetes mellitus   . Dyslipidemia 07/29/2019  . Essential hypertension 07/19/2011  . First degree AV block 07/19/2011  . Generalized obesity 07/29/2019  . GERD (gastroesophageal reflux disease)   . Headache(784.0)    migraines  . History of colon polyps   . HTN (hypertension) 07/19/2011  . Hypercholesterolemia 07/29/2019  . Hyperlipidemia   . Hyperlipidemia associated with type 2 diabetes mellitus (HCC) 12/10/2015  . Hyperpotassemia 07/29/2019  . Hypertension   . Increased frequency of urination 06/10/2017   Formatting of this note might be different from the  original. Seems out of proportion to what would be expected for her glycemic control  . Insomnia, unspecified 07/29/2019  . Low back pain 12/06/2012  . Microalbuminuria due to type 2 diabetes mellitus (HCC) 02/12/2016  . Migraine 12/10/2015  . Multinodular goiter 09/15/2011   Too small to need bx   . Secondary hyperparathyroidism of renal origin (HCC) 06/23/2017  . Severe obesity (BMI 35.0-39.9) with comorbidity (HCC) 02/12/2016  . Type 2 diabetes mellitus with hyperglycemia, with long-term current use of insulin (HCC) 02/12/2016  . 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 Uncontrolled type 2 diabetes mellitus with chronic kidney disease (HCC) 10/08/2011  . Uncontrolled type II diabetes with stage 3 chronic kidney disease (HCC) 06/23/2017    PAST SURGICAL HISTORY:   Past Surgical History:  Procedure Laterality Date  . ABDOMINAL HYSTERECTOMY    . APPENDECTOMY  1963  . BLADDER TACK    . BREAST BIOPSY  1999  . CATARACT EXTRACTION, BILATERAL Bilateral 06/13/2014  . CHOLECYSTECTOMY    . EXCISIONAL HEMORRHOIDECTOMY    . LEFT KNEE REPAIR    . ROTATOR CUFF REPAIR Right 2006  . TUBAL LIGATION      CURRENT MEDICATIONS:   Current Outpatient Medications  Medication Sig Dispense Refill  . albuterol (VENTOLIN HFA) 108 (90 Base) MCG/ACT inhaler Inhale 2 puffs into the lungs as needed. Every 4-6 hours    . amitriptyline (ELAVIL) 50 MG tablet Take 50 mg by mouth daily as needed for  sleep.    . amLODipine (NORVASC) 10 MG tablet Take 10 mg by mouth daily.    Marland Kitchen aspirin 81 MG EC tablet Take 1 tablet by mouth daily.    . Calcium Carb-Cholecalciferol 600-800 MG-UNIT CHEW Chew 1 tablet by mouth daily.    . Cholecalciferol 25 MCG (1000 UT) tablet Take 1,000 Units by mouth daily.    . citalopram (CELEXA) 40 MG tablet Take 40 mg by mouth daily.    . dapagliflozin propanediol (FARXIGA) 5 MG TABS tablet Take 5 mg by mouth daily.    Marland Kitchen dicyclomine (BENTYL) 10 MG capsule  Take 10 mg by mouth every 6 (six) hours as needed for pain.    . Ferrous Gluconate 324 (37.5 Fe) MG TABS Take 1 tablet by mouth daily.    . fluticasone (FLONASE) 50 MCG/ACT nasal spray Place 2 sprays into both nostrils daily.    . hydrochlorothiazide (MICROZIDE) 12.5 MG capsule Take 12.5 mg by mouth daily.    . insulin isophane & regular human (HUMULIN 70/30 MIX) (70-30) 100 UNIT/ML KwikPen Inject 45 mLs into the skin daily with breakfast. And 40 mL with supper    . lisinopril (ZESTRIL) 10 MG tablet Take 10 mg by mouth daily.    . Multiple Vitamin (MULTIVITAMIN) capsule Take 1 capsule by mouth daily.    . Multiple Vitamins-Minerals (MULTIVITAMIN WITH MINERALS) tablet Take 1 tablet by mouth daily.    . Omega-3 Fatty Acids (FISH OIL) 1000 MG CPDR Take 1 capsule by mouth 2 (two) times daily.    . pantoprazole (PROTONIX) 40 MG tablet Take 1 tablet by mouth daily.    . pravastatin (PRAVACHOL) 40 MG tablet Take 40 mg by mouth daily.    . propranolol (INDERAL) 40 MG tablet Take 1 tablet (40 mg total) by mouth 2 (two) times daily. 60 tablet 0  . traMADol (ULTRAM) 50 MG tablet Take 1 tablet by mouth every 6 (six) hours as needed. Patient may take up to 2 every 6 hours prn.    Marland Kitchen VASCEPA 1 g capsule Take 2 capsules by mouth 2 (two) times daily.     No current facility-administered medications for this visit.    ALLERGIES:   Allergies  Allergen Reactions  . Codeine     Chest pain  . Lipitor [Atorvastatin]     Antihyperlipidemics  . Morphine And Related     Chest pain   . Morphine Sulfate   . Zocor [Simvastatin]     FAMILY HISTORY:   Family History  Problem Relation Age of Onset  . Cancer Father        Prostate ancer  . CAD Father   . Heart disease Father   . Hypertension Father   . Heart attack Father   . Stroke Father   . Cancer Other        Breast Cancer-Parent  . Hypertension Other        Parent  . Breast cancer Mother   . Hypertension Mother   . Parkinson's disease Mother    . CAD Brother   . Heart disease Brother   . Hypertension Brother   . Diabetes Mellitus I Maternal Grandmother     SOCIAL HISTORY:  The patient was born and raised in [place].  [place] the [place].  She is widowed, with 2 children, 4 grandchildren and 1 great-grandchild.  She worked at a department store for 18 years.  There is no history of alcoholism or tobacco abuse.  REVIEW OF SYSTEMS:  Review of Systems  Constitutional: Negative for fatigue and fever.  HENT:   Negative for hearing loss and sore throat.   Eyes: Negative for eye problems.  Respiratory: Negative for chest tightness, cough and hemoptysis.   Cardiovascular: Negative for chest pain and palpitations.  Gastrointestinal: Negative for abdominal distention, abdominal pain, blood in stool, constipation, diarrhea, nausea and vomiting.  Endocrine: Negative for hot flashes.  Genitourinary: Negative for difficulty urinating, dysuria, frequency, hematuria and nocturia.   Musculoskeletal: Positive for arthralgias. Negative for back pain, gait problem and myalgias.  Skin: Negative.  Negative for itching and rash.  Neurological: Positive for headaches. Negative for dizziness, extremity weakness, gait problem, light-headedness and numbness.  Hematological: Negative.   Psychiatric/Behavioral: Positive for depression. Negative for suicidal ideas. The patient is not nervous/anxious.      PHYSICAL EXAM:  Blood pressure (!) 207/95, pulse 77, temperature 98.3 F (36.8 C), resp. rate 14, height 5\' 2"  (1.575 m), weight 216 lb 9.6 oz (98.2 kg), SpO2 97 %. Wt Readings from Last 3 Encounters:  05/29/20 216 lb 9.6 oz (98.2 kg)  08/01/19 214 lb 3.2 oz (97.2 kg)  06/14/14 193 lb (87.5 kg)   Body mass index is 39.62 kg/m. Performance status (ECOG): 1 - Symptomatic but completely ambulatory Physical Exam Constitutional:      Appearance: Normal appearance. She is not ill-appearing.  HENT:     Mouth/Throat:     Mouth:  Mucous membranes are moist.     Pharynx: Oropharynx is clear. No oropharyngeal exudate or posterior oropharyngeal erythema.  Cardiovascular:     Rate and Rhythm: Normal rate and regular rhythm.     Heart sounds: No murmur heard. No friction rub. No gallop.   Pulmonary:     Effort: Pulmonary effort is normal. No respiratory distress.     Breath sounds: Normal breath sounds. No wheezing, rhonchi or rales.  Chest:  Breasts:     Right: No axillary adenopathy or supraclavicular adenopathy.     Left: No axillary adenopathy or supraclavicular adenopathy.    Abdominal:     General: Bowel sounds are normal. There is no distension.     Palpations: Abdomen is soft. There is no mass.     Tenderness: There is no abdominal tenderness.  Musculoskeletal:        General: No swelling.     Right lower leg: No edema.     Left lower leg: No edema.  Lymphadenopathy:     Cervical: No cervical adenopathy.     Upper Body:     Right upper body: No supraclavicular or axillary adenopathy.     Left upper body: No supraclavicular or axillary adenopathy.     Lower Body: No right inguinal adenopathy. No left inguinal adenopathy.  Skin:    General: Skin is warm.     Coloration: Skin is not jaundiced.     Findings: No lesion or rash.  Neurological:     General: No focal deficit present.     Mental Status: She is alert and oriented to person, place, and time. Mental status is at baseline.     Cranial Nerves: Cranial nerves are intact.  Psychiatric:        Mood and Affect: Mood normal.        Behavior: Behavior normal.        Thought Content: Thought content normal.    LABS:   CBC Latest Ref Rng & Units 05/29/2020 07/18/2011 07/17/2011  WBC - 11.7 11.9(H) 17.0(H)  Hemoglobin 12.0 - 16.0 11.4(A) 10.5(L) 11.5(L)  Hematocrit 36 - 46 34(A) 31.1(L) 34.1(L)  Platelets 150 - 399 320 196 200   CMP Latest Ref Rng & Units 02/13/2014 07/18/2011 07/17/2011  Glucose 70 - 99 mg/dL 016(W) 109(N) 235(T)  BUN 6 - 23 mg/dL  21 23 22   Creatinine 0.4 - 1.2 mg/dL ) 7.3(U) 2.02(R)  Sodium 135 - 145 mEq/L 138 138 136  Potassium 3.5 - 5.1 mEq/L 5.1 3.9 4.8  Chloride 96 - 112 mEq/L 102 109 108  CO2 19 - 32 mEq/L 25 23 21   Calcium 8.4 - 10.5 mg/dL 9.1 8.9 8.7    ASSESSMENT & PLAN:  A 74 y.o. female who I was asked to consult upon for leukocytosis.  Her white count of 11.7 today is only minimally elevated.  As she has had steroid injections in her left knee, this could be the explanation behind her mild leukocytosis.  Steroids of any type/formulation can lead to a mild degree of leukocytosis.  There is nothing per her history or physical exam to suggest an ominous process is present.  Clinically, the patient is doing well.  I do feel comfortable turning her care back over to her primary care office.  My recommendation would be for her CBC to be checked 2-3 times per year.  I would not have a problem seeing her in the future if her white cells rise above 20 or other hematologic changes develop which require immediate clinical attention.  The patient understands all the plans discussed today and is in agreement with them.  I do appreciate Moon, Amy A, NP for his new consult.   Elek Holderness , MD

## 2020-05-29 ENCOUNTER — Inpatient Hospital Stay (INDEPENDENT_AMBULATORY_CARE_PROVIDER_SITE_OTHER): Payer: Medicare PPO | Admitting: Oncology

## 2020-05-29 ENCOUNTER — Other Ambulatory Visit: Payer: Self-pay | Admitting: Hematology and Oncology

## 2020-05-29 ENCOUNTER — Inpatient Hospital Stay: Payer: Medicare PPO | Attending: Oncology

## 2020-05-29 ENCOUNTER — Other Ambulatory Visit: Payer: Self-pay

## 2020-05-29 ENCOUNTER — Encounter: Payer: Self-pay | Admitting: Oncology

## 2020-05-29 VITALS — BP 207/95 | HR 77 | Temp 98.3°F | Resp 14 | Ht 62.0 in | Wt 216.6 lb

## 2020-05-29 DIAGNOSIS — D72829 Elevated white blood cell count, unspecified: Secondary | ICD-10-CM | POA: Diagnosis not present

## 2020-05-29 DIAGNOSIS — D638 Anemia in other chronic diseases classified elsewhere: Secondary | ICD-10-CM | POA: Diagnosis not present

## 2020-05-29 DIAGNOSIS — D649 Anemia, unspecified: Secondary | ICD-10-CM | POA: Diagnosis not present

## 2020-05-29 LAB — CBC: RBC: 3.77 — AB (ref 3.87–5.11)

## 2020-05-29 LAB — CBC AND DIFFERENTIAL
HCT: 34 — AB (ref 36–46)
Hemoglobin: 11.4 — AB (ref 12.0–16.0)
Neutrophils Absolute: 8.19
Platelets: 320 (ref 150–399)
WBC: 11.7

## 2020-06-07 DIAGNOSIS — N183 Chronic kidney disease, stage 3 unspecified: Secondary | ICD-10-CM | POA: Diagnosis not present

## 2020-06-07 DIAGNOSIS — Z794 Long term (current) use of insulin: Secondary | ICD-10-CM | POA: Diagnosis not present

## 2020-06-07 DIAGNOSIS — E785 Hyperlipidemia, unspecified: Secondary | ICD-10-CM | POA: Diagnosis not present

## 2020-06-07 DIAGNOSIS — I10 Essential (primary) hypertension: Secondary | ICD-10-CM | POA: Diagnosis not present

## 2020-06-07 DIAGNOSIS — E1165 Type 2 diabetes mellitus with hyperglycemia: Secondary | ICD-10-CM | POA: Diagnosis not present

## 2020-06-07 DIAGNOSIS — F321 Major depressive disorder, single episode, moderate: Secondary | ICD-10-CM | POA: Diagnosis not present

## 2020-06-07 DIAGNOSIS — D638 Anemia in other chronic diseases classified elsewhere: Secondary | ICD-10-CM | POA: Diagnosis not present

## 2020-06-07 DIAGNOSIS — Z6839 Body mass index (BMI) 39.0-39.9, adult: Secondary | ICD-10-CM | POA: Diagnosis not present

## 2020-06-07 DIAGNOSIS — E1122 Type 2 diabetes mellitus with diabetic chronic kidney disease: Secondary | ICD-10-CM | POA: Diagnosis not present

## 2020-06-14 DIAGNOSIS — N2581 Secondary hyperparathyroidism of renal origin: Secondary | ICD-10-CM | POA: Diagnosis not present

## 2020-06-14 DIAGNOSIS — E042 Nontoxic multinodular goiter: Secondary | ICD-10-CM | POA: Diagnosis not present

## 2020-06-14 DIAGNOSIS — R809 Proteinuria, unspecified: Secondary | ICD-10-CM | POA: Diagnosis not present

## 2020-06-14 DIAGNOSIS — N183 Chronic kidney disease, stage 3 unspecified: Secondary | ICD-10-CM | POA: Diagnosis not present

## 2020-06-14 DIAGNOSIS — E782 Mixed hyperlipidemia: Secondary | ICD-10-CM | POA: Diagnosis not present

## 2020-06-14 DIAGNOSIS — E1165 Type 2 diabetes mellitus with hyperglycemia: Secondary | ICD-10-CM | POA: Diagnosis not present

## 2020-06-14 DIAGNOSIS — N184 Chronic kidney disease, stage 4 (severe): Secondary | ICD-10-CM | POA: Diagnosis not present

## 2020-06-14 DIAGNOSIS — I10 Essential (primary) hypertension: Secondary | ICD-10-CM | POA: Diagnosis not present

## 2020-07-18 DIAGNOSIS — N189 Chronic kidney disease, unspecified: Secondary | ICD-10-CM | POA: Diagnosis not present

## 2020-07-18 DIAGNOSIS — E1165 Type 2 diabetes mellitus with hyperglycemia: Secondary | ICD-10-CM | POA: Diagnosis not present

## 2020-07-18 DIAGNOSIS — E1122 Type 2 diabetes mellitus with diabetic chronic kidney disease: Secondary | ICD-10-CM | POA: Diagnosis not present

## 2020-07-27 DIAGNOSIS — N184 Chronic kidney disease, stage 4 (severe): Secondary | ICD-10-CM | POA: Diagnosis not present

## 2020-08-01 DIAGNOSIS — I129 Hypertensive chronic kidney disease with stage 1 through stage 4 chronic kidney disease, or unspecified chronic kidney disease: Secondary | ICD-10-CM | POA: Diagnosis not present

## 2020-08-01 DIAGNOSIS — N1832 Chronic kidney disease, stage 3b: Secondary | ICD-10-CM | POA: Diagnosis not present

## 2020-08-01 DIAGNOSIS — E1122 Type 2 diabetes mellitus with diabetic chronic kidney disease: Secondary | ICD-10-CM | POA: Diagnosis not present

## 2020-08-01 DIAGNOSIS — Z794 Long term (current) use of insulin: Secondary | ICD-10-CM | POA: Diagnosis not present

## 2020-08-01 DIAGNOSIS — Z6841 Body Mass Index (BMI) 40.0 and over, adult: Secondary | ICD-10-CM | POA: Diagnosis not present

## 2020-08-01 DIAGNOSIS — N183 Chronic kidney disease, stage 3 unspecified: Secondary | ICD-10-CM | POA: Diagnosis not present

## 2020-08-24 DIAGNOSIS — M1712 Unilateral primary osteoarthritis, left knee: Secondary | ICD-10-CM | POA: Diagnosis not present

## 2020-09-06 DIAGNOSIS — E1165 Type 2 diabetes mellitus with hyperglycemia: Secondary | ICD-10-CM | POA: Diagnosis not present

## 2020-09-06 DIAGNOSIS — Z794 Long term (current) use of insulin: Secondary | ICD-10-CM | POA: Diagnosis not present

## 2020-09-06 DIAGNOSIS — G8929 Other chronic pain: Secondary | ICD-10-CM | POA: Diagnosis not present

## 2020-09-06 DIAGNOSIS — M25562 Pain in left knee: Secondary | ICD-10-CM | POA: Diagnosis not present

## 2020-09-06 DIAGNOSIS — E1122 Type 2 diabetes mellitus with diabetic chronic kidney disease: Secondary | ICD-10-CM | POA: Diagnosis not present

## 2020-09-06 DIAGNOSIS — E785 Hyperlipidemia, unspecified: Secondary | ICD-10-CM | POA: Diagnosis not present

## 2020-09-06 DIAGNOSIS — Z6839 Body mass index (BMI) 39.0-39.9, adult: Secondary | ICD-10-CM | POA: Diagnosis not present

## 2020-09-06 DIAGNOSIS — I10 Essential (primary) hypertension: Secondary | ICD-10-CM | POA: Diagnosis not present

## 2020-09-06 DIAGNOSIS — D638 Anemia in other chronic diseases classified elsewhere: Secondary | ICD-10-CM | POA: Diagnosis not present

## 2020-09-13 DIAGNOSIS — E1165 Type 2 diabetes mellitus with hyperglycemia: Secondary | ICD-10-CM | POA: Diagnosis not present

## 2020-09-13 DIAGNOSIS — R809 Proteinuria, unspecified: Secondary | ICD-10-CM | POA: Diagnosis not present

## 2020-09-13 DIAGNOSIS — I10 Essential (primary) hypertension: Secondary | ICD-10-CM | POA: Diagnosis not present

## 2020-09-13 DIAGNOSIS — N183 Chronic kidney disease, stage 3 unspecified: Secondary | ICD-10-CM | POA: Diagnosis not present

## 2020-09-13 DIAGNOSIS — N2581 Secondary hyperparathyroidism of renal origin: Secondary | ICD-10-CM | POA: Diagnosis not present

## 2020-10-22 DIAGNOSIS — M25562 Pain in left knee: Secondary | ICD-10-CM | POA: Diagnosis not present

## 2020-10-22 DIAGNOSIS — E119 Type 2 diabetes mellitus without complications: Secondary | ICD-10-CM | POA: Diagnosis not present

## 2020-10-22 DIAGNOSIS — H3581 Retinal edema: Secondary | ICD-10-CM | POA: Diagnosis not present

## 2020-10-22 DIAGNOSIS — G8929 Other chronic pain: Secondary | ICD-10-CM | POA: Diagnosis not present

## 2020-10-22 DIAGNOSIS — Z6838 Body mass index (BMI) 38.0-38.9, adult: Secondary | ICD-10-CM | POA: Diagnosis not present

## 2020-11-22 DIAGNOSIS — M25562 Pain in left knee: Secondary | ICD-10-CM | POA: Diagnosis not present

## 2020-11-22 DIAGNOSIS — Z6838 Body mass index (BMI) 38.0-38.9, adult: Secondary | ICD-10-CM | POA: Diagnosis not present

## 2020-11-22 DIAGNOSIS — M1990 Unspecified osteoarthritis, unspecified site: Secondary | ICD-10-CM | POA: Diagnosis not present

## 2020-11-22 DIAGNOSIS — Z79899 Other long term (current) drug therapy: Secondary | ICD-10-CM | POA: Diagnosis not present

## 2020-11-22 DIAGNOSIS — G8929 Other chronic pain: Secondary | ICD-10-CM | POA: Diagnosis not present

## 2020-11-30 DIAGNOSIS — N1832 Chronic kidney disease, stage 3b: Secondary | ICD-10-CM | POA: Diagnosis not present

## 2020-12-18 DIAGNOSIS — N1832 Chronic kidney disease, stage 3b: Secondary | ICD-10-CM | POA: Diagnosis not present

## 2020-12-18 DIAGNOSIS — N183 Chronic kidney disease, stage 3 unspecified: Secondary | ICD-10-CM | POA: Diagnosis not present

## 2020-12-18 DIAGNOSIS — E1129 Type 2 diabetes mellitus with other diabetic kidney complication: Secondary | ICD-10-CM | POA: Diagnosis not present

## 2020-12-18 DIAGNOSIS — I129 Hypertensive chronic kidney disease with stage 1 through stage 4 chronic kidney disease, or unspecified chronic kidney disease: Secondary | ICD-10-CM | POA: Diagnosis not present

## 2020-12-18 DIAGNOSIS — E1122 Type 2 diabetes mellitus with diabetic chronic kidney disease: Secondary | ICD-10-CM | POA: Diagnosis not present

## 2020-12-18 DIAGNOSIS — Z794 Long term (current) use of insulin: Secondary | ICD-10-CM | POA: Diagnosis not present

## 2020-12-18 DIAGNOSIS — E669 Obesity, unspecified: Secondary | ICD-10-CM | POA: Diagnosis not present

## 2020-12-18 DIAGNOSIS — R809 Proteinuria, unspecified: Secondary | ICD-10-CM | POA: Diagnosis not present

## 2020-12-27 DIAGNOSIS — G8929 Other chronic pain: Secondary | ICD-10-CM | POA: Diagnosis not present

## 2020-12-27 DIAGNOSIS — M25562 Pain in left knee: Secondary | ICD-10-CM | POA: Diagnosis not present

## 2020-12-27 DIAGNOSIS — I10 Essential (primary) hypertension: Secondary | ICD-10-CM | POA: Diagnosis not present

## 2020-12-27 DIAGNOSIS — M1990 Unspecified osteoarthritis, unspecified site: Secondary | ICD-10-CM | POA: Diagnosis not present

## 2020-12-27 DIAGNOSIS — Z79899 Other long term (current) drug therapy: Secondary | ICD-10-CM | POA: Diagnosis not present

## 2021-01-15 DIAGNOSIS — N2581 Secondary hyperparathyroidism of renal origin: Secondary | ICD-10-CM | POA: Diagnosis not present

## 2021-01-15 DIAGNOSIS — E782 Mixed hyperlipidemia: Secondary | ICD-10-CM | POA: Diagnosis not present

## 2021-01-15 DIAGNOSIS — E042 Nontoxic multinodular goiter: Secondary | ICD-10-CM | POA: Diagnosis not present

## 2021-01-15 DIAGNOSIS — N183 Chronic kidney disease, stage 3 unspecified: Secondary | ICD-10-CM | POA: Diagnosis not present

## 2021-01-15 DIAGNOSIS — I10 Essential (primary) hypertension: Secondary | ICD-10-CM | POA: Diagnosis not present

## 2021-01-15 DIAGNOSIS — R809 Proteinuria, unspecified: Secondary | ICD-10-CM | POA: Diagnosis not present

## 2021-01-15 DIAGNOSIS — Z6841 Body Mass Index (BMI) 40.0 and over, adult: Secondary | ICD-10-CM | POA: Diagnosis not present

## 2021-01-15 DIAGNOSIS — E1165 Type 2 diabetes mellitus with hyperglycemia: Secondary | ICD-10-CM | POA: Diagnosis not present

## 2021-01-18 DIAGNOSIS — Z6837 Body mass index (BMI) 37.0-37.9, adult: Secondary | ICD-10-CM | POA: Diagnosis not present

## 2021-01-18 DIAGNOSIS — F4321 Adjustment disorder with depressed mood: Secondary | ICD-10-CM | POA: Diagnosis not present

## 2021-01-29 DIAGNOSIS — R531 Weakness: Secondary | ICD-10-CM | POA: Diagnosis not present

## 2021-01-29 DIAGNOSIS — Z79899 Other long term (current) drug therapy: Secondary | ICD-10-CM | POA: Diagnosis not present

## 2021-01-29 DIAGNOSIS — Z794 Long term (current) use of insulin: Secondary | ICD-10-CM | POA: Diagnosis not present

## 2021-01-29 DIAGNOSIS — I10 Essential (primary) hypertension: Secondary | ICD-10-CM | POA: Diagnosis not present

## 2021-01-29 DIAGNOSIS — M1990 Unspecified osteoarthritis, unspecified site: Secondary | ICD-10-CM | POA: Diagnosis not present

## 2021-01-29 DIAGNOSIS — G8929 Other chronic pain: Secondary | ICD-10-CM | POA: Diagnosis not present

## 2021-01-29 DIAGNOSIS — E1122 Type 2 diabetes mellitus with diabetic chronic kidney disease: Secondary | ICD-10-CM | POA: Diagnosis not present

## 2021-01-29 DIAGNOSIS — M25562 Pain in left knee: Secondary | ICD-10-CM | POA: Diagnosis not present

## 2021-01-29 DIAGNOSIS — E1165 Type 2 diabetes mellitus with hyperglycemia: Secondary | ICD-10-CM | POA: Diagnosis not present

## 2021-02-05 DIAGNOSIS — Z23 Encounter for immunization: Secondary | ICD-10-CM | POA: Diagnosis not present

## 2021-02-05 DIAGNOSIS — I10 Essential (primary) hypertension: Secondary | ICD-10-CM | POA: Diagnosis not present

## 2021-02-05 DIAGNOSIS — F4321 Adjustment disorder with depressed mood: Secondary | ICD-10-CM | POA: Diagnosis not present

## 2021-02-05 DIAGNOSIS — M1990 Unspecified osteoarthritis, unspecified site: Secondary | ICD-10-CM | POA: Diagnosis not present

## 2021-02-05 DIAGNOSIS — R531 Weakness: Secondary | ICD-10-CM | POA: Diagnosis not present

## 2021-02-06 DIAGNOSIS — E669 Obesity, unspecified: Secondary | ICD-10-CM | POA: Diagnosis not present

## 2021-02-06 DIAGNOSIS — Z139 Encounter for screening, unspecified: Secondary | ICD-10-CM | POA: Diagnosis not present

## 2021-02-06 DIAGNOSIS — E785 Hyperlipidemia, unspecified: Secondary | ICD-10-CM | POA: Diagnosis not present

## 2021-02-06 DIAGNOSIS — Z1331 Encounter for screening for depression: Secondary | ICD-10-CM | POA: Diagnosis not present

## 2021-02-06 DIAGNOSIS — Z9181 History of falling: Secondary | ICD-10-CM | POA: Diagnosis not present

## 2021-02-06 DIAGNOSIS — Z0000 Encounter for general adult medical examination without abnormal findings: Secondary | ICD-10-CM | POA: Diagnosis not present

## 2021-03-08 DIAGNOSIS — I10 Essential (primary) hypertension: Secondary | ICD-10-CM | POA: Diagnosis not present

## 2021-03-08 DIAGNOSIS — R531 Weakness: Secondary | ICD-10-CM | POA: Diagnosis not present

## 2021-03-08 DIAGNOSIS — F4321 Adjustment disorder with depressed mood: Secondary | ICD-10-CM | POA: Diagnosis not present

## 2021-03-08 DIAGNOSIS — M1990 Unspecified osteoarthritis, unspecified site: Secondary | ICD-10-CM | POA: Diagnosis not present

## 2021-04-08 DIAGNOSIS — M1990 Unspecified osteoarthritis, unspecified site: Secondary | ICD-10-CM | POA: Diagnosis not present

## 2021-04-08 DIAGNOSIS — Z79899 Other long term (current) drug therapy: Secondary | ICD-10-CM | POA: Diagnosis not present

## 2021-04-08 DIAGNOSIS — F4321 Adjustment disorder with depressed mood: Secondary | ICD-10-CM | POA: Diagnosis not present

## 2021-04-18 DIAGNOSIS — N1832 Chronic kidney disease, stage 3b: Secondary | ICD-10-CM | POA: Diagnosis not present

## 2021-04-23 DIAGNOSIS — N1832 Chronic kidney disease, stage 3b: Secondary | ICD-10-CM | POA: Diagnosis not present

## 2021-04-23 DIAGNOSIS — I129 Hypertensive chronic kidney disease with stage 1 through stage 4 chronic kidney disease, or unspecified chronic kidney disease: Secondary | ICD-10-CM | POA: Diagnosis not present

## 2021-04-23 DIAGNOSIS — E669 Obesity, unspecified: Secondary | ICD-10-CM | POA: Diagnosis not present

## 2021-04-23 DIAGNOSIS — E1122 Type 2 diabetes mellitus with diabetic chronic kidney disease: Secondary | ICD-10-CM | POA: Diagnosis not present

## 2021-04-23 DIAGNOSIS — N183 Chronic kidney disease, stage 3 unspecified: Secondary | ICD-10-CM | POA: Diagnosis not present

## 2021-04-23 DIAGNOSIS — Z794 Long term (current) use of insulin: Secondary | ICD-10-CM | POA: Diagnosis not present

## 2021-05-09 DIAGNOSIS — E1165 Type 2 diabetes mellitus with hyperglycemia: Secondary | ICD-10-CM | POA: Diagnosis not present

## 2021-05-09 DIAGNOSIS — M1990 Unspecified osteoarthritis, unspecified site: Secondary | ICD-10-CM | POA: Diagnosis not present

## 2021-05-09 DIAGNOSIS — F4321 Adjustment disorder with depressed mood: Secondary | ICD-10-CM | POA: Diagnosis not present

## 2021-05-09 DIAGNOSIS — Z6836 Body mass index (BMI) 36.0-36.9, adult: Secondary | ICD-10-CM | POA: Diagnosis not present

## 2021-05-09 DIAGNOSIS — I10 Essential (primary) hypertension: Secondary | ICD-10-CM | POA: Diagnosis not present

## 2021-05-09 DIAGNOSIS — M25562 Pain in left knee: Secondary | ICD-10-CM | POA: Diagnosis not present

## 2021-05-09 DIAGNOSIS — E1122 Type 2 diabetes mellitus with diabetic chronic kidney disease: Secondary | ICD-10-CM | POA: Diagnosis not present

## 2021-05-09 DIAGNOSIS — G8929 Other chronic pain: Secondary | ICD-10-CM | POA: Diagnosis not present

## 2021-05-09 DIAGNOSIS — N183 Chronic kidney disease, stage 3 unspecified: Secondary | ICD-10-CM | POA: Diagnosis not present

## 2021-05-15 DIAGNOSIS — Z1231 Encounter for screening mammogram for malignant neoplasm of breast: Secondary | ICD-10-CM | POA: Diagnosis not present

## 2021-06-10 DIAGNOSIS — M1990 Unspecified osteoarthritis, unspecified site: Secondary | ICD-10-CM | POA: Diagnosis not present

## 2021-06-10 DIAGNOSIS — E875 Hyperkalemia: Secondary | ICD-10-CM | POA: Diagnosis not present

## 2021-06-10 DIAGNOSIS — M25562 Pain in left knee: Secondary | ICD-10-CM | POA: Diagnosis not present

## 2021-06-10 DIAGNOSIS — G8929 Other chronic pain: Secondary | ICD-10-CM | POA: Diagnosis not present

## 2021-06-11 DIAGNOSIS — N183 Chronic kidney disease, stage 3 unspecified: Secondary | ICD-10-CM | POA: Diagnosis not present

## 2021-06-11 DIAGNOSIS — N2581 Secondary hyperparathyroidism of renal origin: Secondary | ICD-10-CM | POA: Diagnosis not present

## 2021-06-11 DIAGNOSIS — Z6841 Body Mass Index (BMI) 40.0 and over, adult: Secondary | ICD-10-CM | POA: Diagnosis not present

## 2021-06-11 DIAGNOSIS — R809 Proteinuria, unspecified: Secondary | ICD-10-CM | POA: Diagnosis not present

## 2021-06-11 DIAGNOSIS — I10 Essential (primary) hypertension: Secondary | ICD-10-CM | POA: Diagnosis not present

## 2021-06-11 DIAGNOSIS — E782 Mixed hyperlipidemia: Secondary | ICD-10-CM | POA: Diagnosis not present

## 2021-06-11 DIAGNOSIS — E1165 Type 2 diabetes mellitus with hyperglycemia: Secondary | ICD-10-CM | POA: Diagnosis not present

## 2021-06-11 DIAGNOSIS — Z634 Disappearance and death of family member: Secondary | ICD-10-CM | POA: Diagnosis not present

## 2021-06-11 DIAGNOSIS — E042 Nontoxic multinodular goiter: Secondary | ICD-10-CM | POA: Diagnosis not present

## 2021-06-28 DIAGNOSIS — S82831A Other fracture of upper and lower end of right fibula, initial encounter for closed fracture: Secondary | ICD-10-CM | POA: Diagnosis not present

## 2021-06-28 DIAGNOSIS — R55 Syncope and collapse: Secondary | ICD-10-CM | POA: Diagnosis not present

## 2021-06-28 DIAGNOSIS — S82431A Displaced oblique fracture of shaft of right fibula, initial encounter for closed fracture: Secondary | ICD-10-CM | POA: Diagnosis not present

## 2021-06-28 DIAGNOSIS — S82891A Other fracture of right lower leg, initial encounter for closed fracture: Secondary | ICD-10-CM | POA: Diagnosis not present

## 2021-06-28 DIAGNOSIS — I517 Cardiomegaly: Secondary | ICD-10-CM | POA: Diagnosis not present

## 2021-06-28 DIAGNOSIS — R279 Unspecified lack of coordination: Secondary | ICD-10-CM | POA: Diagnosis not present

## 2021-06-28 DIAGNOSIS — E162 Hypoglycemia, unspecified: Secondary | ICD-10-CM | POA: Diagnosis not present

## 2021-06-28 DIAGNOSIS — K219 Gastro-esophageal reflux disease without esophagitis: Secondary | ICD-10-CM | POA: Diagnosis not present

## 2021-06-28 DIAGNOSIS — M25579 Pain in unspecified ankle and joints of unspecified foot: Secondary | ICD-10-CM | POA: Diagnosis not present

## 2021-06-28 DIAGNOSIS — S82891D Other fracture of right lower leg, subsequent encounter for closed fracture with routine healing: Secondary | ICD-10-CM | POA: Diagnosis not present

## 2021-06-28 DIAGNOSIS — R609 Edema, unspecified: Secondary | ICD-10-CM | POA: Diagnosis not present

## 2021-06-28 DIAGNOSIS — E7800 Pure hypercholesterolemia, unspecified: Secondary | ICD-10-CM | POA: Diagnosis not present

## 2021-06-28 DIAGNOSIS — N183 Chronic kidney disease, stage 3 unspecified: Secondary | ICD-10-CM | POA: Diagnosis not present

## 2021-06-28 DIAGNOSIS — M1990 Unspecified osteoarthritis, unspecified site: Secondary | ICD-10-CM | POA: Diagnosis not present

## 2021-06-28 DIAGNOSIS — I10 Essential (primary) hypertension: Secondary | ICD-10-CM | POA: Diagnosis not present

## 2021-06-28 DIAGNOSIS — S8261XA Displaced fracture of lateral malleolus of right fibula, initial encounter for closed fracture: Secondary | ICD-10-CM | POA: Diagnosis not present

## 2021-06-28 DIAGNOSIS — I129 Hypertensive chronic kidney disease with stage 1 through stage 4 chronic kidney disease, or unspecified chronic kidney disease: Secondary | ICD-10-CM | POA: Diagnosis not present

## 2021-06-28 DIAGNOSIS — M7989 Other specified soft tissue disorders: Secondary | ICD-10-CM | POA: Diagnosis not present

## 2021-06-28 DIAGNOSIS — G8918 Other acute postprocedural pain: Secondary | ICD-10-CM | POA: Diagnosis not present

## 2021-06-28 DIAGNOSIS — E161 Other hypoglycemia: Secondary | ICD-10-CM | POA: Diagnosis not present

## 2021-06-28 DIAGNOSIS — W19XXXA Unspecified fall, initial encounter: Secondary | ICD-10-CM | POA: Diagnosis not present

## 2021-06-28 DIAGNOSIS — E1122 Type 2 diabetes mellitus with diabetic chronic kidney disease: Secondary | ICD-10-CM | POA: Diagnosis not present

## 2021-06-28 DIAGNOSIS — S8251XA Displaced fracture of medial malleolus of right tibia, initial encounter for closed fracture: Secondary | ICD-10-CM | POA: Diagnosis not present

## 2021-06-28 DIAGNOSIS — N189 Chronic kidney disease, unspecified: Secondary | ICD-10-CM | POA: Diagnosis not present

## 2021-06-28 DIAGNOSIS — S82854A Nondisplaced trimalleolar fracture of right lower leg, initial encounter for closed fracture: Secondary | ICD-10-CM | POA: Diagnosis not present

## 2021-06-28 DIAGNOSIS — D649 Anemia, unspecified: Secondary | ICD-10-CM | POA: Diagnosis not present

## 2021-06-28 DIAGNOSIS — Z043 Encounter for examination and observation following other accident: Secondary | ICD-10-CM | POA: Diagnosis not present

## 2021-06-28 DIAGNOSIS — Z7401 Bed confinement status: Secondary | ICD-10-CM | POA: Diagnosis not present

## 2021-06-28 DIAGNOSIS — S82851A Displaced trimalleolar fracture of right lower leg, initial encounter for closed fracture: Secondary | ICD-10-CM | POA: Diagnosis not present

## 2021-06-30 HISTORY — PX: ANKLE FRACTURE SURGERY: SHX122

## 2021-07-02 DIAGNOSIS — F32A Depression, unspecified: Secondary | ICD-10-CM | POA: Diagnosis not present

## 2021-07-02 DIAGNOSIS — F4320 Adjustment disorder, unspecified: Secondary | ICD-10-CM | POA: Diagnosis not present

## 2021-07-02 DIAGNOSIS — E1151 Type 2 diabetes mellitus with diabetic peripheral angiopathy without gangrene: Secondary | ICD-10-CM | POA: Diagnosis not present

## 2021-07-02 DIAGNOSIS — S82854A Nondisplaced trimalleolar fracture of right lower leg, initial encounter for closed fracture: Secondary | ICD-10-CM | POA: Diagnosis not present

## 2021-07-02 DIAGNOSIS — F4321 Adjustment disorder with depressed mood: Secondary | ICD-10-CM | POA: Diagnosis not present

## 2021-07-02 DIAGNOSIS — W19XXXA Unspecified fall, initial encounter: Secondary | ICD-10-CM | POA: Diagnosis not present

## 2021-07-02 DIAGNOSIS — R262 Difficulty in walking, not elsewhere classified: Secondary | ICD-10-CM | POA: Diagnosis not present

## 2021-07-02 DIAGNOSIS — R55 Syncope and collapse: Secondary | ICD-10-CM | POA: Diagnosis not present

## 2021-07-02 DIAGNOSIS — S82899D Other fracture of unspecified lower leg, subsequent encounter for closed fracture with routine healing: Secondary | ICD-10-CM | POA: Diagnosis not present

## 2021-07-02 DIAGNOSIS — R279 Unspecified lack of coordination: Secondary | ICD-10-CM | POA: Diagnosis not present

## 2021-07-02 DIAGNOSIS — G8918 Other acute postprocedural pain: Secondary | ICD-10-CM | POA: Diagnosis not present

## 2021-07-02 DIAGNOSIS — Z7401 Bed confinement status: Secondary | ICD-10-CM | POA: Diagnosis not present

## 2021-07-02 DIAGNOSIS — S82891D Other fracture of right lower leg, subsequent encounter for closed fracture with routine healing: Secondary | ICD-10-CM | POA: Diagnosis not present

## 2021-07-03 DIAGNOSIS — R262 Difficulty in walking, not elsewhere classified: Secondary | ICD-10-CM | POA: Diagnosis not present

## 2021-07-03 DIAGNOSIS — S82899D Other fracture of unspecified lower leg, subsequent encounter for closed fracture with routine healing: Secondary | ICD-10-CM | POA: Diagnosis not present

## 2021-07-03 DIAGNOSIS — E1151 Type 2 diabetes mellitus with diabetic peripheral angiopathy without gangrene: Secondary | ICD-10-CM | POA: Diagnosis not present

## 2021-07-03 DIAGNOSIS — G8918 Other acute postprocedural pain: Secondary | ICD-10-CM | POA: Diagnosis not present

## 2021-07-23 DIAGNOSIS — E1165 Type 2 diabetes mellitus with hyperglycemia: Secondary | ICD-10-CM | POA: Diagnosis not present

## 2021-07-23 DIAGNOSIS — E11649 Type 2 diabetes mellitus with hypoglycemia without coma: Secondary | ICD-10-CM | POA: Diagnosis not present

## 2021-07-23 DIAGNOSIS — Z79899 Other long term (current) drug therapy: Secondary | ICD-10-CM | POA: Diagnosis not present

## 2021-07-23 DIAGNOSIS — N183 Chronic kidney disease, stage 3 unspecified: Secondary | ICD-10-CM | POA: Diagnosis not present

## 2021-07-23 DIAGNOSIS — E1122 Type 2 diabetes mellitus with diabetic chronic kidney disease: Secondary | ICD-10-CM | POA: Diagnosis not present

## 2021-07-23 DIAGNOSIS — R55 Syncope and collapse: Secondary | ICD-10-CM | POA: Diagnosis not present

## 2021-07-23 DIAGNOSIS — Z794 Long term (current) use of insulin: Secondary | ICD-10-CM | POA: Diagnosis not present

## 2021-07-23 DIAGNOSIS — D638 Anemia in other chronic diseases classified elsewhere: Secondary | ICD-10-CM | POA: Diagnosis not present

## 2021-08-13 DIAGNOSIS — S82851D Displaced trimalleolar fracture of right lower leg, subsequent encounter for closed fracture with routine healing: Secondary | ICD-10-CM | POA: Diagnosis not present

## 2021-08-13 DIAGNOSIS — S82853A Displaced trimalleolar fracture of unspecified lower leg, initial encounter for closed fracture: Secondary | ICD-10-CM | POA: Diagnosis not present

## 2021-09-10 DIAGNOSIS — S82851D Displaced trimalleolar fracture of right lower leg, subsequent encounter for closed fracture with routine healing: Secondary | ICD-10-CM | POA: Diagnosis not present

## 2021-09-12 DIAGNOSIS — E785 Hyperlipidemia, unspecified: Secondary | ICD-10-CM | POA: Diagnosis not present

## 2021-09-12 DIAGNOSIS — D638 Anemia in other chronic diseases classified elsewhere: Secondary | ICD-10-CM | POA: Diagnosis not present

## 2021-09-12 DIAGNOSIS — I10 Essential (primary) hypertension: Secondary | ICD-10-CM | POA: Diagnosis not present

## 2021-09-12 DIAGNOSIS — E1165 Type 2 diabetes mellitus with hyperglycemia: Secondary | ICD-10-CM | POA: Diagnosis not present

## 2021-09-12 DIAGNOSIS — M1990 Unspecified osteoarthritis, unspecified site: Secondary | ICD-10-CM | POA: Diagnosis not present

## 2021-09-12 DIAGNOSIS — F321 Major depressive disorder, single episode, moderate: Secondary | ICD-10-CM | POA: Diagnosis not present

## 2021-09-12 DIAGNOSIS — Z794 Long term (current) use of insulin: Secondary | ICD-10-CM | POA: Diagnosis not present

## 2021-09-12 DIAGNOSIS — N183 Chronic kidney disease, stage 3 unspecified: Secondary | ICD-10-CM | POA: Diagnosis not present

## 2021-09-12 DIAGNOSIS — E1122 Type 2 diabetes mellitus with diabetic chronic kidney disease: Secondary | ICD-10-CM | POA: Diagnosis not present

## 2021-09-20 DIAGNOSIS — N1832 Chronic kidney disease, stage 3b: Secondary | ICD-10-CM | POA: Diagnosis not present

## 2021-09-26 DIAGNOSIS — N1832 Chronic kidney disease, stage 3b: Secondary | ICD-10-CM | POA: Diagnosis not present

## 2021-09-26 DIAGNOSIS — I129 Hypertensive chronic kidney disease with stage 1 through stage 4 chronic kidney disease, or unspecified chronic kidney disease: Secondary | ICD-10-CM | POA: Diagnosis not present

## 2021-09-26 DIAGNOSIS — Z794 Long term (current) use of insulin: Secondary | ICD-10-CM | POA: Diagnosis not present

## 2021-09-26 DIAGNOSIS — E669 Obesity, unspecified: Secondary | ICD-10-CM | POA: Diagnosis not present

## 2021-09-26 DIAGNOSIS — N183 Chronic kidney disease, stage 3 unspecified: Secondary | ICD-10-CM | POA: Diagnosis not present

## 2021-09-26 DIAGNOSIS — E1122 Type 2 diabetes mellitus with diabetic chronic kidney disease: Secondary | ICD-10-CM | POA: Diagnosis not present

## 2021-10-07 DIAGNOSIS — E1165 Type 2 diabetes mellitus with hyperglycemia: Secondary | ICD-10-CM | POA: Diagnosis not present

## 2021-10-07 DIAGNOSIS — Z794 Long term (current) use of insulin: Secondary | ICD-10-CM | POA: Diagnosis not present

## 2021-10-09 DIAGNOSIS — Z634 Disappearance and death of family member: Secondary | ICD-10-CM | POA: Diagnosis not present

## 2021-10-09 DIAGNOSIS — N183 Chronic kidney disease, stage 3 unspecified: Secondary | ICD-10-CM | POA: Diagnosis not present

## 2021-10-09 DIAGNOSIS — Z6841 Body Mass Index (BMI) 40.0 and over, adult: Secondary | ICD-10-CM | POA: Diagnosis not present

## 2021-10-09 DIAGNOSIS — E1165 Type 2 diabetes mellitus with hyperglycemia: Secondary | ICD-10-CM | POA: Diagnosis not present

## 2021-10-09 DIAGNOSIS — E782 Mixed hyperlipidemia: Secondary | ICD-10-CM | POA: Diagnosis not present

## 2021-10-09 DIAGNOSIS — N2581 Secondary hyperparathyroidism of renal origin: Secondary | ICD-10-CM | POA: Diagnosis not present

## 2021-10-09 DIAGNOSIS — R809 Proteinuria, unspecified: Secondary | ICD-10-CM | POA: Diagnosis not present

## 2021-10-09 DIAGNOSIS — E042 Nontoxic multinodular goiter: Secondary | ICD-10-CM | POA: Diagnosis not present

## 2021-10-09 DIAGNOSIS — I10 Essential (primary) hypertension: Secondary | ICD-10-CM | POA: Diagnosis not present

## 2021-10-21 DIAGNOSIS — E113292 Type 2 diabetes mellitus with mild nonproliferative diabetic retinopathy without macular edema, left eye: Secondary | ICD-10-CM | POA: Diagnosis not present

## 2021-11-04 DIAGNOSIS — M25561 Pain in right knee: Secondary | ICD-10-CM | POA: Diagnosis not present

## 2021-11-06 DIAGNOSIS — Z794 Long term (current) use of insulin: Secondary | ICD-10-CM | POA: Diagnosis not present

## 2021-11-06 DIAGNOSIS — E1165 Type 2 diabetes mellitus with hyperglycemia: Secondary | ICD-10-CM | POA: Diagnosis not present

## 2021-12-13 DIAGNOSIS — E1122 Type 2 diabetes mellitus with diabetic chronic kidney disease: Secondary | ICD-10-CM | POA: Diagnosis not present

## 2021-12-13 DIAGNOSIS — E785 Hyperlipidemia, unspecified: Secondary | ICD-10-CM | POA: Diagnosis not present

## 2021-12-13 DIAGNOSIS — D638 Anemia in other chronic diseases classified elsewhere: Secondary | ICD-10-CM | POA: Diagnosis not present

## 2021-12-13 DIAGNOSIS — Z794 Long term (current) use of insulin: Secondary | ICD-10-CM | POA: Diagnosis not present

## 2021-12-13 DIAGNOSIS — N183 Chronic kidney disease, stage 3 unspecified: Secondary | ICD-10-CM | POA: Diagnosis not present

## 2021-12-13 DIAGNOSIS — I10 Essential (primary) hypertension: Secondary | ICD-10-CM | POA: Diagnosis not present

## 2021-12-13 DIAGNOSIS — E1165 Type 2 diabetes mellitus with hyperglycemia: Secondary | ICD-10-CM | POA: Diagnosis not present

## 2021-12-13 DIAGNOSIS — M1990 Unspecified osteoarthritis, unspecified site: Secondary | ICD-10-CM | POA: Diagnosis not present

## 2021-12-13 DIAGNOSIS — F321 Major depressive disorder, single episode, moderate: Secondary | ICD-10-CM | POA: Diagnosis not present

## 2022-01-02 ENCOUNTER — Encounter: Payer: Self-pay | Admitting: Cardiology

## 2022-01-02 ENCOUNTER — Encounter: Payer: Self-pay | Admitting: *Deleted

## 2022-01-13 NOTE — Progress Notes (Deleted)
Cardiology Office Note:    Date:  01/13/2022   ID:  Mary Norris, DOB [birth year], MRN [id number]  PCP:  Lowella Dandy, NP  Cardiologist:  Shirlee More, MD    Referring MD: Lowella Dandy, NP    ASSESSMENT:    No diagnosis found. PLAN:    In order of problems listed above:  ***   Next appointment: ***   Medication Adjustments/Labs and Tests Ordered: Current medicines are reviewed at length with the patient today.  Concerns regarding medicines are outlined above.  No orders of the defined types were placed in this encounter.  No orders of the defined types were placed in this encounter.   No chief complaint on file.   History of Present Illness:    Mary Norris is a 76 y.o. female with a hx of hypertension stage III CKD type 2 diabetes and hyperlipidemia last seen 08/01/2019 in  preoperative evaluation.  Compliance with diet, lifestyle and medications: *** Past Medical History:  Diagnosis Date   Allergy    Anemia, chronic disease 07/29/2019   Anxiety    ARF (acute renal failure) ([place]) 07/17/2011   Arthritis    Benign hypertension with chronic kidney disease, stage III ([place]) 07/19/2011   Chicken pox    Chronic renal failure 07/19/2011   CKD (chronic kidney disease) stage 3, GFR 30-59 ml/min (HCC) 05/14/2016   Current moderate episode of major depressive disorder ([place]) 07/29/2019   Diabetes mellitus    Dyslipidemia 07/29/2019   Essential hypertension 07/19/2011   First degree AV block 07/19/2011   Generalized obesity 07/29/2019   GERD (gastroesophageal reflux disease)    Headache(784.0)    migraines   History of colon polyps    Hypercholesterolemia 07/29/2019   Hyperlipidemia    Hyperlipidemia associated with type 2 diabetes mellitus ([place]) 12/10/2015   Hyperpotassemia 07/29/2019   Increased frequency of urination 06/10/2017   Formatting of this note might be different from the original. 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 10/08/2011   Uncontrolled type 2 diabetes mellitus with chronic kidney disease 10/08/2011    Past Surgical History:  Procedure Laterality Date   ABDOMINAL HYSTERECTOMY     ANKLE FRACTURE SURGERY Right 06/30/2021   Ankle fracture dislocation repair   APPENDECTOMY  1963   BLADDER TACK     BREAST BIOPSY  1999   CATARACT EXTRACTION, BILATERAL Bilateral 06/13/2014   CHOLECYSTECTOMY     EXCISIONAL HEMORRHOIDECTOMY     LEFT KNEE REPAIR     ROTATOR CUFF REPAIR Right 2006   TUBAL LIGATION      Current Medications: No outpatient medications have been marked as taking for the 01/14/22 encounter (Appointment) with Richardo Priest, MD.     Allergies:   Codeine, Lipitor [atorvastatin], Morphine and related, Morphine sulfate, and Zocor [simvastatin]   Social History   Socioeconomic History   Marital status: Widowed    Spouse name: Not on file  Number of children: 2   Years of education: 12   Highest education level: Not on file  Occupational History   Occupation: Retired    Comment: [employer]  Tobacco Use   Smoking status: Never   Smokeless tobacco: Never  Substance and Sexual Activity   Alcohol use: No   Drug use: No   Sexual activity: Not Currently  Other Topics Concern   Not on file  Social History Narrative   Not on file   Social Determinants of Health   Financial Resource Strain: Not on file  Food Insecurity: Not on  file  Transportation Needs: Not on file  Physical Activity: Not on file  Stress: Not on file  Social Connections: Not on file     Family History: The patient's ***family history includes Breast cancer in her mother; CAD in her brother and father; Cancer in her father and another family member; Diabetes Mellitus I in her maternal grandmother; Heart attack in her father; Heart disease in her brother and father; Hypertension in her brother, father, mother, and another family member; Parkinson's disease in her mother; Stroke in her father. ROS:   Please see the history of present illness.    All other systems reviewed and are negative.  EKGs/Labs/Other Studies Reviewed:    The following studies were reviewed today:  EKG:  EKG ordered today and personally reviewed.  The ekg ordered today demonstrates ***  Recent Labs: No results found for requested labs within last 365 days.  Recent Lipid Panel    Component Value Date/Time   CHOL 148 02/13/2014 1103   TRIG 347.0 (H) 02/13/2014 1103   HDL 28.70 (L) 02/13/2014 1103   CHOLHDL 5 02/13/2014 1103   VLDL 69.4 (H) 02/13/2014 1103   LDLDIRECT 74.9 02/13/2014 1103    Physical Exam:    VS:  There were no vitals taken for this visit.    Wt Readings from Last 3 Encounters:  12/13/21 188 lb (85.3 kg)  05/29/20 216 lb 9.6 oz (98.2 kg)  08/01/19 214 lb 3.2 oz (97.2 kg)     GEN: *** Well nourished, well developed in no acute distress HEENT: Normal NECK: No JVD; No carotid bruits LYMPHATICS: No lymphadenopathy CARDIAC: ***RRR, no murmurs, rubs, gallops RESPIRATORY:  Clear to auscultation without rales, wheezing or rhonchi  ABDOMEN: Soft, non-tender, non-distended MUSCULOSKELETAL:  No edema; No deformity  SKIN: Warm and dry NEUROLOGIC:  Alert and oriented x 3 PSYCHIATRIC:  Normal affect    Signed, Shirlee More, MD  01/13/2022 12:26 PM    Mary Norris

## 2022-01-14 ENCOUNTER — Ambulatory Visit: Payer: Medicare HMO | Admitting: Cardiology

## 2022-01-24 DIAGNOSIS — N1832 Chronic kidney disease, stage 3b: Secondary | ICD-10-CM | POA: Diagnosis not present

## 2022-01-28 DIAGNOSIS — E1122 Type 2 diabetes mellitus with diabetic chronic kidney disease: Secondary | ICD-10-CM | POA: Diagnosis not present

## 2022-01-28 DIAGNOSIS — Z794 Long term (current) use of insulin: Secondary | ICD-10-CM | POA: Diagnosis not present

## 2022-01-28 DIAGNOSIS — Z1331 Encounter for screening for depression: Secondary | ICD-10-CM | POA: Diagnosis not present

## 2022-01-28 DIAGNOSIS — N1832 Chronic kidney disease, stage 3b: Secondary | ICD-10-CM | POA: Diagnosis not present

## 2022-01-28 DIAGNOSIS — I129 Hypertensive chronic kidney disease with stage 1 through stage 4 chronic kidney disease, or unspecified chronic kidney disease: Secondary | ICD-10-CM | POA: Diagnosis not present

## 2022-01-29 DIAGNOSIS — Z794 Long term (current) use of insulin: Secondary | ICD-10-CM | POA: Diagnosis not present

## 2022-01-29 DIAGNOSIS — E1165 Type 2 diabetes mellitus with hyperglycemia: Secondary | ICD-10-CM | POA: Diagnosis not present

## 2022-02-17 DIAGNOSIS — E113292 Type 2 diabetes mellitus with mild nonproliferative diabetic retinopathy without macular edema, left eye: Secondary | ICD-10-CM | POA: Diagnosis not present

## 2022-02-24 DIAGNOSIS — H6993 Unspecified Eustachian tube disorder, bilateral: Secondary | ICD-10-CM | POA: Diagnosis not present

## 2022-02-24 DIAGNOSIS — H906 Mixed conductive and sensorineural hearing loss, bilateral: Secondary | ICD-10-CM | POA: Diagnosis not present

## 2022-02-24 DIAGNOSIS — H6593 Unspecified nonsuppurative otitis media, bilateral: Secondary | ICD-10-CM | POA: Diagnosis not present

## 2022-02-25 ENCOUNTER — Encounter: Payer: Self-pay | Admitting: Cardiology

## 2022-02-25 ENCOUNTER — Ambulatory Visit: Payer: Medicare HMO | Attending: Cardiology | Admitting: Cardiology

## 2022-02-25 ENCOUNTER — Ambulatory Visit: Payer: Medicare HMO | Attending: Cardiology

## 2022-02-25 VITALS — BP 128/74 | HR 82 | Ht 62.0 in | Wt 194.4 lb

## 2022-02-25 DIAGNOSIS — Z794 Long term (current) use of insulin: Secondary | ICD-10-CM

## 2022-02-25 DIAGNOSIS — R55 Syncope and collapse: Secondary | ICD-10-CM | POA: Diagnosis not present

## 2022-02-25 DIAGNOSIS — E782 Mixed hyperlipidemia: Secondary | ICD-10-CM

## 2022-02-25 DIAGNOSIS — E119 Type 2 diabetes mellitus without complications: Secondary | ICD-10-CM | POA: Diagnosis not present

## 2022-02-25 DIAGNOSIS — I10 Essential (primary) hypertension: Secondary | ICD-10-CM

## 2022-02-25 NOTE — Patient Instructions (Signed)
Medication Instructions:  Your physician has recommended you make the following change in your medication:  Stop taking the Amitriptyline   *If you need a refill on your cardiac medications before your next appointment, please call your pharmacy*   Lab Work: NONE If you have labs (blood work) drawn today and your tests are completely normal, you will receive your results only by: [place] (if you have MyChart) OR A paper copy in the mail If you have any lab test that is abnormal or we need to change your treatment, we will call you to review the results.   Testing/Procedures: You have been asked to wear a Zio Heart Monitor today. It is to be worn for 7 days. Please remove the monitor on 03/04/22  and mail back in the box provided.  If you have any questions about the monitor please call the company at [phone]     Follow-Up: At [facility], you and your health needs are our priority.  As part of our continuing mission to provide you with exceptional heart care, we have created designated Provider Care Teams.  These Care Teams include your primary Cardiologist (physician) and Advanced Practice Providers (APPs -  Physician Assistants and Nurse Practitioners) who all work together to provide you with the care you need, when you need it.  We recommend signing up for the patient portal called "MyChart".  Sign up information is provided on this After Visit Summary.  MyChart is used to connect with patients for Virtual Visits (Telemedicine).  Patients are able to view lab/test results, encounter notes, upcoming appointments, etc.  Non-urgent messages can be sent to your provider as well.   To learn more about what you can do with MyChart, go to [email or web address].    Your next appointment:   3 month(s)  The format for your next appointment:   In Person  Provider:   Shirlee More, MD    Other Instructions Purchase an [employer] BP cuff Check and Record BP twice daily  sitting and standing  Important Information About Sugar

## 2022-02-25 NOTE — Progress Notes (Signed)
Cardiology Office Note:    Date:  02/25/2022   ID:  Mary Norris, DOB 1945/06/10, MRN [id number]  PCP:  Hurshel Party, NP  Cardiologist:  Norman Herrlich, MD    Referring MD: Hurshel Party, NP    ASSESSMENT:    1. Syncope and collapse   2. Primary hypertension   3. Type 2 diabetes mellitus without complication, with long-term current use of insulin (HCC)   4. Mixed hyperlipidemia    PLAN:    In order of problems listed above:  This is a complex case she obviously had a profound episode of loss of consciousness differential diagnosis is orthostatic hypotension related to a tricyclic antidepressant and high-dose amlodipine versus hypoglycemia.  Since then she has had episodes that are less specific she uses the word fall but she does have orthostatic lightheadedness in our office she had a significant drop standing and I think that she is having symptomatic orthostatic hypotension.  I asked her to purchase a validated blood pressure cuff and to check her blood pressure sitting and standing daily and bring it to physicians.  I told her she cannot take Elavil any longer.  If she continues to have orthostatic symptoms and drop in blood pressure will need to stop Norvasc.  I will apply a 7-day event monitor to screen for bradycardia and I encouraged her to buy an Apple Watch to self monitor at home and for safety.  If she has recurrent episodes of syncope she needed implanted loop recorder.  There is no indication of seizure disorder and I do not think she needs a neurologic evaluation. Check home blood pressure sitting and standing record and bring to physicians.  If she continues to have orthostatic drop we will need to stop amlodipine and choose a different agent.  Continue her thiazide diuretic and ACE inhibitor Fortunately she denies a continuous glucose monitor she may have had hypoglycemia when she fractured her ankle. She will continue her statin   Next appointment: I asked her to  return in 3 months   Medication Adjustments/Labs and Tests Ordered: Current medicines are reviewed at length with the patient today.  Concerns regarding medicines are outlined above.  No orders of the defined types were placed in this encounter.  No orders of the defined types were placed in this encounter.  Chief complaint I am referred here because of several episodes of falling   History of Present Illness:    Mary Norris is a 76 y.o. female with a hx of hypertension with stage III's CKD hyperlipidemia and type 2 diabetes last seen 08/01/2019 and preoperative evaluation.  Seen today for evaluation of syncope.  She had a echocardiogram at [facility] 06/28/2021 showed mild LVH normal left ventricular contractility.  Otherwise normal.  She was admitted to [facility] 06/28/2021 after syncope with fracture and dislocation of the right ankle.  In the emergency room her pulse was 73 bpm blood pressure 164/74 or.  Her EKG showed sinus rhythm QS in V2 lead placement versus old septal infarction.  Laboratory studies showed a hemoglobin of 13.4 platelets 315,000 creatinine 1.50 potassium 4.5 GFR 34 cc troponin was assessed nondetectable proBNP level was low.  Compliance with diet, lifestyle and medications: Yes  I reviewed her medications, it says she takes amitriptyline as needed she takes it every night for sleep She does have orthostatic symptoms. She is also had significant hypoglycemia in the past blood sugars down in the 40s and now with a continuous  monitor less than 60 at times She is never had any heart problems were told she had slow heart rates no history of congenital rheumatic heart disease or atrial fibrillation The episode in March was clearly syncopal or severe hypoglycemia she has no recall of events other than being on the ground with the trauma fractured ankle. She was not seen by cardiology during the hospitalization. As then she has had episodes she calls  falls where she is weak goes to the ground but does not lose consciousness. She has not no history of a seizure disorder and has had no ictal activity or incontinence No edema shortness of breath chest pain or palpitation Past Medical History:  Diagnosis Date   Allergy    Anemia, chronic disease 07/29/2019   Anxiety    ARF (acute renal failure) (HCC) 07/17/2011   Arthritis    Benign hypertension with chronic kidney disease, stage III (HCC) 07/19/2011   Chicken pox    Chronic renal failure 07/19/2011   CKD (chronic kidney disease) stage 3, GFR 30-59 ml/min (HCC) 05/14/2016   Current moderate episode of major depressive disorder (HCC) 07/29/2019   Diabetes mellitus    Dyslipidemia 07/29/2019   Essential hypertension 07/19/2011   First degree AV block 07/19/2011   Generalized obesity 07/29/2019   GERD (gastroesophageal reflux disease)    Headache(784.0)    migraines   History of colon polyps    Hypercholesterolemia 07/29/2019   Hyperlipidemia    Hyperlipidemia associated with type 2 diabetes mellitus (HCC) 12/10/2015   Hyperpotassemia 07/29/2019   Increased frequency of urination 06/10/2017   Formatting of this note might be different from the original. 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 10/08/2011   Uncontrolled type 2 diabetes mellitus with chronic kidney disease 10/08/2011    Past Surgical History:   Procedure Laterality Date   ABDOMINAL HYSTERECTOMY     ANKLE FRACTURE SURGERY Right 06/30/2021   Ankle fracture dislocation repair   APPENDECTOMY  1963   BLADDER TACK     BREAST BIOPSY  1999   CATARACT EXTRACTION, BILATERAL Bilateral 06/13/2014   CHOLECYSTECTOMY     EXCISIONAL HEMORRHOIDECTOMY     LEFT KNEE REPAIR     ROTATOR CUFF REPAIR Right 2006   TUBAL LIGATION      Current Medications: Current Meds  Medication Sig   amitriptyline (ELAVIL) 50 MG tablet Take 50 mg by mouth daily as needed for sleep.   amLODipine (NORVASC) 10 MG tablet Take 10 mg by mouth daily.   aspirin 81 MG EC tablet Take 1 tablet by mouth daily.   busPIRone (BUSPAR) 5 MG tablet Take 5 mg by mouth 3 (three) times daily as needed for anxiety.   Calcium Carb-Cholecalciferol 600-800 MG-UNIT CHEW Chew 1 tablet by mouth daily.   Cholecalciferol 25 MCG (1000 UT) tablet Take 1,000 Units by mouth daily.   citalopram (CELEXA) 40 MG tablet Take 40 mg by mouth daily.   dicyclomine (  BENTYL) 10 MG capsule Take 10 mg by mouth every 6 (six) hours as needed for pain.   ferrous sulfate 325 (65 FE) MG tablet Take 1 tablet by mouth daily with breakfast.   fluticasone (FLONASE) 50 MCG/ACT nasal spray Place 1 spray into both nostrils daily.   hydrochlorothiazide (MICROZIDE) 12.5 MG capsule Take 12.5 mg by mouth daily.   insulin NPH-regular Human (70-30) 100 UNIT/ML injection Inject 45 Units into the skin 2 (two) times daily with a meal.   lisinopril (ZESTRIL) 40 MG tablet Take 1 tablet by mouth daily.   Multiple Vitamin (MULTIVITAMIN) capsule Take 1 capsule by mouth daily.   pantoprazole (PROTONIX) 40 MG tablet Take 1 tablet by mouth daily.   pravastatin (PRAVACHOL) 40 MG tablet Take 40 mg by mouth daily.   predniSONE (DELTASONE) 10 MG tablet Take by mouth. Dose pack as directed   propranolol (INDERAL) 40 MG tablet Take 1 tablet (40 mg total) by mouth 2 (two) times daily.     Allergies:   Codeine, Lipitor [atorvastatin],  Morphine and related, Morphine sulfate, and Zocor [simvastatin]   Social History   Socioeconomic History   Marital status: Widowed    Spouse name: Not on file   Number of children: 2   Years of education: 34   Highest education level: Not on file  Occupational History   Occupation: Retired    Comment: [employer]  Tobacco Use   Smoking status: Never   Smokeless tobacco: Never  Substance and Sexual Activity   Alcohol use: No   Drug use: No   Sexual activity: Not Currently  Other Topics Concern   Not on file  Social History Narrative   Not on file   Social Determinants of Health   Financial Resource Strain: Not on file  Food Insecurity: Not on file  Transportation Needs: Not on file  Physical Activity: Not on file  Stress: Not on file  Social Connections: Not on file     Family History: The patient's family history includes Breast cancer in her mother; CAD in her brother and father; Cancer in her father and another family member; Diabetes Mellitus I in her maternal grandmother; Heart attack in her father; Heart disease in her brother and father; Hypertension in her brother, father, mother, and another family member; Parkinson's disease in her mother; Stroke in her father. ROS:   Please see the history of present illness.    All other systems reviewed and are negative.  EKGs/Labs/Other Studies Reviewed:    The following studies were reviewed today:  EKG:  EKG ordered today and personally reviewed.  The ekg ordered today demonstrates sinus rhythm nonspecific QRS prolongation does not fulfill criteria for bundle branch block otherwise normal EKG  Recent Labs: 12/14/18: Cholesterol 170 LDL 89 hemoglobin 11.1 creatinine 1.3 potassium 4.6 Recent Lipid Panel    Component Value Date/Time   CHOL 148 02/13/2014 1103   TRIG 347.0 (H) 02/13/2014 1103   HDL 28.70 (L) 02/13/2014 1103   CHOLHDL 5 02/13/2014 1103   VLDL 69.4 (H) 02/13/2014 1103   LDLDIRECT 74.9 02/13/2014 1103     Physical Exam:    VS:  BP 128/74 (BP Location: Right Arm, Patient Position: Standing, Cuff Size: Large)   Pulse 82   Ht 5\' 2"  (1.575 m)   Wt 194 lb 6.4 oz (88.2 kg)   SpO2 99%   BMI 35.56 kg/m     Wt Readings from Last 3 Encounters:  02/25/22 194 lb 6.4 oz (88.2 kg)  12/13/21 188 lb (85.3 kg)  05/29/20 216 lb 9.6 oz (98.2 kg)     GEN:  Well nourished, well developed in no acute distress HEENT: Normal NECK: No JVD; No carotid bruits LYMPHATICS: No lymphadenopathy CARDIAC: RRR, no murmurs, rubs, gallops RESPIRATORY:  Clear to auscultation without rales, wheezing or rhonchi  ABDOMEN: Soft, non-tender, non-distended MUSCULOSKELETAL:  No edema; No deformity  SKIN: Warm and dry NEUROLOGIC:  Alert and oriented x 3 PSYCHIATRIC:  Normal affect    Signed, Shirlee More, MD  02/25/2022 11:50 AM    [place]

## 2022-02-27 DIAGNOSIS — E042 Nontoxic multinodular goiter: Secondary | ICD-10-CM | POA: Diagnosis not present

## 2022-02-27 DIAGNOSIS — N2581 Secondary hyperparathyroidism of renal origin: Secondary | ICD-10-CM | POA: Diagnosis not present

## 2022-02-27 DIAGNOSIS — I10 Essential (primary) hypertension: Secondary | ICD-10-CM | POA: Diagnosis not present

## 2022-02-27 DIAGNOSIS — E782 Mixed hyperlipidemia: Secondary | ICD-10-CM | POA: Diagnosis not present

## 2022-02-27 DIAGNOSIS — N183 Chronic kidney disease, stage 3 unspecified: Secondary | ICD-10-CM | POA: Diagnosis not present

## 2022-02-27 DIAGNOSIS — R809 Proteinuria, unspecified: Secondary | ICD-10-CM | POA: Diagnosis not present

## 2022-02-27 DIAGNOSIS — E1165 Type 2 diabetes mellitus with hyperglycemia: Secondary | ICD-10-CM | POA: Diagnosis not present

## 2022-02-28 DIAGNOSIS — E1165 Type 2 diabetes mellitus with hyperglycemia: Secondary | ICD-10-CM | POA: Diagnosis not present

## 2022-02-28 DIAGNOSIS — Z794 Long term (current) use of insulin: Secondary | ICD-10-CM | POA: Diagnosis not present

## 2022-03-11 DIAGNOSIS — H6593 Unspecified nonsuppurative otitis media, bilateral: Secondary | ICD-10-CM | POA: Diagnosis not present

## 2022-03-11 DIAGNOSIS — H6993 Unspecified Eustachian tube disorder, bilateral: Secondary | ICD-10-CM | POA: Diagnosis not present

## 2022-03-17 DIAGNOSIS — R55 Syncope and collapse: Secondary | ICD-10-CM | POA: Diagnosis not present

## 2022-03-21 DIAGNOSIS — I10 Essential (primary) hypertension: Secondary | ICD-10-CM | POA: Diagnosis not present

## 2022-03-21 DIAGNOSIS — N183 Chronic kidney disease, stage 3 unspecified: Secondary | ICD-10-CM | POA: Diagnosis not present

## 2022-03-21 DIAGNOSIS — E1122 Type 2 diabetes mellitus with diabetic chronic kidney disease: Secondary | ICD-10-CM | POA: Diagnosis not present

## 2022-03-21 DIAGNOSIS — Z9181 History of falling: Secondary | ICD-10-CM | POA: Diagnosis not present

## 2022-03-21 DIAGNOSIS — E785 Hyperlipidemia, unspecified: Secondary | ICD-10-CM | POA: Diagnosis not present

## 2022-03-21 DIAGNOSIS — Z794 Long term (current) use of insulin: Secondary | ICD-10-CM | POA: Diagnosis not present

## 2022-03-21 DIAGNOSIS — D638 Anemia in other chronic diseases classified elsewhere: Secondary | ICD-10-CM | POA: Diagnosis not present

## 2022-03-21 DIAGNOSIS — E1165 Type 2 diabetes mellitus with hyperglycemia: Secondary | ICD-10-CM | POA: Diagnosis not present

## 2022-03-21 DIAGNOSIS — M1990 Unspecified osteoarthritis, unspecified site: Secondary | ICD-10-CM | POA: Diagnosis not present

## 2022-03-31 DIAGNOSIS — E1165 Type 2 diabetes mellitus with hyperglycemia: Secondary | ICD-10-CM | POA: Diagnosis not present

## 2022-03-31 DIAGNOSIS — Z794 Long term (current) use of insulin: Secondary | ICD-10-CM | POA: Diagnosis not present

## 2022-04-14 DIAGNOSIS — H6593 Unspecified nonsuppurative otitis media, bilateral: Secondary | ICD-10-CM | POA: Diagnosis not present

## 2022-04-14 DIAGNOSIS — H6993 Unspecified Eustachian tube disorder, bilateral: Secondary | ICD-10-CM | POA: Diagnosis not present

## 2022-05-26 DIAGNOSIS — Z86010 Personal history of colonic polyps: Secondary | ICD-10-CM | POA: Insufficient documentation

## 2022-05-26 DIAGNOSIS — B019 Varicella without complication: Secondary | ICD-10-CM | POA: Insufficient documentation

## 2022-05-26 DIAGNOSIS — F419 Anxiety disorder, unspecified: Secondary | ICD-10-CM | POA: Insufficient documentation

## 2022-05-26 DIAGNOSIS — T7840XA Allergy, unspecified, initial encounter: Secondary | ICD-10-CM | POA: Insufficient documentation

## 2022-05-27 NOTE — Progress Notes (Unsigned)
Cardiology Office Note:    Date:  05/28/2022   ID:  Mary Norris, DOB 1945-09-25, MRN [id number]  PCP:  Lowella Dandy, NP  Cardiologist:  Shirlee More, MD    Referring MD: Lowella Dandy, NP    ASSESSMENT:    1. Syncope and collapse   2. Hypotension due to drugs   3. Primary hypertension    PLAN:    In order of problems listed above:  Improved monitor does not show any indication of bradycardia or sustained arrhythmia to account for syncope offered a tricyclic antidepressant My concern predominantly is her blood pressure control she will purchase a validated device good technique check daily sitting and standing I like to see her systolics less than 174 at rest greater than 100 210 standing and if she is not at goal transition to a more effective beta-blocker for hypertension carvedilol   Next appointment: I will see back in the office as needed   Medication Adjustments/Labs and Tests Ordered: Current medicines are reviewed at length with the patient today.  Concerns regarding medicines are outlined above.  No orders of the defined types were placed in this encounter.  No orders of the defined types were placed in this encounter.   Chief complaint follow-up bradycardia and orthostatic hypotension   History of Present Illness:    Mary Norris is a 77 y.o. female with a hx of hypertension and stage III CKD type 2 diabetes hyperlipidemia and syncope last seen 02/25/2022.  She has significant orthostatic drop in blood pressure in the office and was taking 2 medications tricyclic antidepressant amlodipine potentiating postural change in blood pressure.  An event monitor for 12 days showing no bradycardia AV block or sinus pauses and no sustained arrhythmia to account for syncope  Compliance with diet, lifestyle and medications: Yes  Since stopping her tricyclic antidepressant she has had no further lightheadedness she is not checking her blood pressure at home she  will purchase a validated device [employer] use good technique record sitting and standing send me a list in 2 weeks. Clinically I feel comfortable history of orthostatic hypotension causing syncope For follow-up my office will be as needed Pretension she will continue amlodipine along with propranolol lisinopril and she is taking a thiazide diuretic.  If blood pressure is not at target I think she would benefit transitioning from propranolol to carvedilol for additional antihypertensive effect Past Medical History:  Diagnosis Date   Allergy    Anemia, chronic disease 07/29/2019   Anxiety    ARF (acute renal failure) ([place]) 07/17/2011   Arthritis    Benign hypertension with chronic kidney disease, stage III ([place]) 07/19/2011   Chicken pox    Chronic renal failure 07/19/2011   CKD (chronic kidney disease) stage 3, GFR 30-59 ml/min (HCC) 05/14/2016   Current moderate episode of major depressive disorder ([place]) 07/29/2019   Diabetes mellitus    Dyslipidemia 07/29/2019   Essential hypertension 07/19/2011   First degree AV block 07/19/2011   Generalized obesity 07/29/2019   GERD (gastroesophageal reflux disease)    Headache(784.0)    migraines   History of colon polyps    Hypercholesterolemia 07/29/2019   Hyperlipidemia    Hyperlipidemia associated with type 2 diabetes mellitus ([place]) 12/10/2015   Hyperpotassemia 07/29/2019   Increased frequency of urination 06/10/2017   Formatting of this note might be different from the original. 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 10/08/2011   Uncontrolled type 2 diabetes mellitus with chronic kidney disease 10/08/2011    Past Surgical History:  Procedure Laterality Date   ABDOMINAL HYSTERECTOMY     ANKLE FRACTURE SURGERY Right 06/30/2021   Ankle fracture dislocation repair   APPENDECTOMY  1963   BLADDER TACK     BREAST BIOPSY  1999   CATARACT EXTRACTION, BILATERAL Bilateral 06/13/2014   CHOLECYSTECTOMY     EXCISIONAL HEMORRHOIDECTOMY     LEFT KNEE REPAIR     ROTATOR CUFF REPAIR Right 2006   TUBAL LIGATION      Current Medications: Current Meds  Medication Sig   amLODipine (NORVASC) 10 MG tablet Take 10 mg by mouth daily.   aspirin 81 MG EC tablet Take 1 tablet by mouth daily.   busPIRone (BUSPAR) 5 MG tablet Take 5 mg by mouth 3 (three) times daily as needed for anxiety.   Calcium Carb-Cholecalciferol 600-800 MG-UNIT CHEW Chew 1 tablet by mouth daily.   Cholecalciferol 25 MCG (1000 UT) tablet Take 1,000 Units by mouth daily.   citalopram (CELEXA) 40 MG tablet Take 40 mg by mouth daily.   dicyclomine (BENTYL) 10 MG capsule Take 10 mg by mouth every 6 (six) hours as needed for pain.   ferrous sulfate 325 (65 FE) MG tablet Take 1 tablet by mouth daily with breakfast.   fluticasone (FLONASE) 50 MCG/ACT nasal spray Place 1 spray into both nostrils daily.   hydrochlorothiazide (MICROZIDE) 12.5 MG capsule Take 12.5 mg by mouth daily.   insulin NPH-regular Human (70-30) 100 UNIT/ML injection Inject 45 Units into the skin 2 (two) times daily with a meal.   lisinopril (ZESTRIL) 40 MG tablet Take 1 tablet by mouth daily.   Multiple Vitamin (MULTIVITAMIN) capsule Take 1 capsule by mouth daily.   pantoprazole (PROTONIX) 40 MG tablet Take 1 tablet by mouth daily.   pravastatin (PRAVACHOL) 40 MG tablet Take 40 mg by mouth daily.   predniSONE (DELTASONE) 10 MG  tablet Take by mouth. Dose pack as directed   propranolol (INDERAL) 40 MG tablet Take 1 tablet (40 mg total) by mouth 2 (two) times daily.     Allergies:   Codeine, Lipitor [atorvastatin], Morphine and related, Morphine sulfate, and Zocor [simvastatin]   Social History   Socioeconomic History   Marital status: Widowed    Spouse name: Not on file   Number of children: 2   Years of education: 12   Highest education level: Not on file  Occupational History   Occupation: Retired    Comment: [employer]  Tobacco Use   Smoking status: Never   Smokeless tobacco: Never  Substance and Sexual Activity   Alcohol use: No   Drug use: No   Sexual activity: Not Currently  Other Topics Concern   Not on file  Social History Narrative   Not on file   Social Determinants of Health   Financial Resource Strain: Not on file  Food Insecurity: Not on file  Transportation Needs: Not on file  Physical  Activity: Not on file  Stress: Not on file  Social Connections: Not on file     Family History: The patient's family history includes Breast cancer in her mother; CAD in her brother and father; Cancer in her father and another family member; Diabetes Mellitus I in her maternal grandmother; Heart attack in her father; Heart disease in her brother and father; Hypertension in her brother, father, mother, and another family member; Parkinson's disease in her mother; Stroke in her father. ROS:   Please see the history of present illness.    All other systems reviewed and are negative.  EKGs/Labs/Other Studies Reviewed:    The following studies were reviewed today:  She had a echocardiogram at [facility] 06/28/2021 showed mild LVH normal left ventricular contractility. Otherwise normal.    Recent Labs: No results found for requested labs within last 365 days.  Recent Lipid Panel    Component Value Date/Time   CHOL 148 02/13/2014 1103   TRIG 347.0 (H) 02/13/2014 1103   HDL 28.70 (L) 02/13/2014  1103   CHOLHDL 5 02/13/2014 1103   VLDL 69.4 (H) 02/13/2014 1103   LDLDIRECT 74.9 02/13/2014 1103    Physical Exam:    VS:  BP (!) 156/74 (BP Location: Left Arm, Patient Position: Sitting)   Pulse 88   Ht 5\' 2"  (1.575 m)   Wt 192 lb (87.1 kg)   SpO2 97%   BMI 35.12 kg/m     Wt Readings from Last 3 Encounters:  05/28/22 192 lb (87.1 kg)  02/25/22 194 lb 6.4 oz (88.2 kg)  12/13/21 188 lb (85.3 kg)     GEN:  Well nourished, well developed in no acute distress HEENT: Normal NECK: No JVD; No carotid bruits LYMPHATICS: No lymphadenopathy CARDIAC: RRR, no murmurs, rubs, gallops RESPIRATORY:  Clear to auscultation without rales, wheezing or rhonchi  ABDOMEN: Soft, non-tender, non-distended MUSCULOSKELETAL:  No edema; No deformity  SKIN: Warm and dry NEUROLOGIC:  Alert and oriented x 3 PSYCHIATRIC:  Normal affect    Signed, Shirlee More, MD  05/28/2022 10:31 AM    [place]

## 2022-05-28 ENCOUNTER — Encounter: Payer: Self-pay | Admitting: Cardiology

## 2022-05-28 ENCOUNTER — Ambulatory Visit: Payer: Medicare HMO | Attending: Cardiology | Admitting: Cardiology

## 2022-05-28 VITALS — BP 156/74 | HR 88 | Ht 62.0 in | Wt 192.0 lb

## 2022-05-28 DIAGNOSIS — I952 Hypotension due to drugs: Secondary | ICD-10-CM | POA: Diagnosis not present

## 2022-05-28 DIAGNOSIS — I10 Essential (primary) hypertension: Secondary | ICD-10-CM | POA: Diagnosis not present

## 2022-05-28 DIAGNOSIS — R55 Syncope and collapse: Secondary | ICD-10-CM

## 2022-05-28 NOTE — Patient Instructions (Signed)
Medication Instructions:  Your physician recommends that you continue on your current medications as directed. Please refer to the Current Medication list given to you today.  *If you need a refill on your cardiac medications before your next appointment, please call your pharmacy*   Lab Work: None If you have labs (blood work) drawn today and your tests are completely normal, you will receive your results only by: [place] (if you have MyChart) OR A paper copy in the mail If you have any lab test that is abnormal or we need to change your treatment, we will call you to review the results.   Testing/Procedures: None   Follow-Up: At [facility], you and your health needs are our priority.  As part of our continuing mission to provide you with exceptional heart care, we have created designated Provider Care Teams.  These Care Teams include your primary Cardiologist (physician) and Advanced Practice Providers (APPs -  Physician Assistants and Nurse Practitioners) who all work together to provide you with the care you need, when you need it.  We recommend signing up for the patient portal called "MyChart".  Sign up information is provided on this After Visit Summary.  MyChart is used to connect with patients for Virtual Visits (Telemedicine).  Patients are able to view lab/test results, encounter notes, upcoming appointments, etc.  Non-urgent messages can be sent to your provider as well.   To learn more about what you can do with MyChart, go to [email or web address].    Your next appointment:   Follow up as needed  Provider:   Shirlee More, MD    Other Instructions Get an [employer] blood pressure device and check blood pressures daily both sitting and standing and record.  Send a list of blood pressures via MyChart in 2 weeks.

## 2022-06-02 DIAGNOSIS — I10 Essential (primary) hypertension: Secondary | ICD-10-CM | POA: Diagnosis not present

## 2022-06-02 DIAGNOSIS — N2581 Secondary hyperparathyroidism of renal origin: Secondary | ICD-10-CM | POA: Diagnosis not present

## 2022-06-02 DIAGNOSIS — E1165 Type 2 diabetes mellitus with hyperglycemia: Secondary | ICD-10-CM | POA: Diagnosis not present

## 2022-06-02 DIAGNOSIS — E042 Nontoxic multinodular goiter: Secondary | ICD-10-CM | POA: Diagnosis not present

## 2022-06-02 DIAGNOSIS — R809 Proteinuria, unspecified: Secondary | ICD-10-CM | POA: Diagnosis not present

## 2022-06-02 DIAGNOSIS — N1832 Chronic kidney disease, stage 3b: Secondary | ICD-10-CM | POA: Diagnosis not present

## 2022-06-02 DIAGNOSIS — N183 Chronic kidney disease, stage 3 unspecified: Secondary | ICD-10-CM | POA: Diagnosis not present

## 2022-06-04 DIAGNOSIS — Z6835 Body mass index (BMI) 35.0-35.9, adult: Secondary | ICD-10-CM | POA: Diagnosis not present

## 2022-06-04 DIAGNOSIS — I129 Hypertensive chronic kidney disease with stage 1 through stage 4 chronic kidney disease, or unspecified chronic kidney disease: Secondary | ICD-10-CM | POA: Diagnosis not present

## 2022-06-04 DIAGNOSIS — N1832 Chronic kidney disease, stage 3b: Secondary | ICD-10-CM | POA: Diagnosis not present

## 2022-06-04 DIAGNOSIS — E669 Obesity, unspecified: Secondary | ICD-10-CM | POA: Diagnosis not present

## 2022-06-04 DIAGNOSIS — E1122 Type 2 diabetes mellitus with diabetic chronic kidney disease: Secondary | ICD-10-CM | POA: Diagnosis not present

## 2022-06-04 DIAGNOSIS — Z794 Long term (current) use of insulin: Secondary | ICD-10-CM | POA: Diagnosis not present

## 2022-06-05 DIAGNOSIS — H9193 Unspecified hearing loss, bilateral: Secondary | ICD-10-CM | POA: Diagnosis not present

## 2022-06-05 DIAGNOSIS — H6993 Unspecified Eustachian tube disorder, bilateral: Secondary | ICD-10-CM | POA: Diagnosis not present

## 2022-06-05 DIAGNOSIS — H6523 Chronic serous otitis media, bilateral: Secondary | ICD-10-CM | POA: Diagnosis not present

## 2022-06-09 DIAGNOSIS — E1165 Type 2 diabetes mellitus with hyperglycemia: Secondary | ICD-10-CM | POA: Diagnosis not present

## 2022-06-09 DIAGNOSIS — Z794 Long term (current) use of insulin: Secondary | ICD-10-CM | POA: Diagnosis not present

## 2022-06-23 DIAGNOSIS — I10 Essential (primary) hypertension: Secondary | ICD-10-CM | POA: Diagnosis not present

## 2022-06-23 DIAGNOSIS — F321 Major depressive disorder, single episode, moderate: Secondary | ICD-10-CM | POA: Diagnosis not present

## 2022-06-23 DIAGNOSIS — M1990 Unspecified osteoarthritis, unspecified site: Secondary | ICD-10-CM | POA: Diagnosis not present

## 2022-06-23 DIAGNOSIS — Z794 Long term (current) use of insulin: Secondary | ICD-10-CM | POA: Diagnosis not present

## 2022-06-23 DIAGNOSIS — D638 Anemia in other chronic diseases classified elsewhere: Secondary | ICD-10-CM | POA: Diagnosis not present

## 2022-06-23 DIAGNOSIS — Z139 Encounter for screening, unspecified: Secondary | ICD-10-CM | POA: Diagnosis not present

## 2022-06-23 DIAGNOSIS — E1165 Type 2 diabetes mellitus with hyperglycemia: Secondary | ICD-10-CM | POA: Diagnosis not present

## 2022-06-23 DIAGNOSIS — E785 Hyperlipidemia, unspecified: Secondary | ICD-10-CM | POA: Diagnosis not present

## 2022-06-23 DIAGNOSIS — E1122 Type 2 diabetes mellitus with diabetic chronic kidney disease: Secondary | ICD-10-CM | POA: Diagnosis not present

## 2022-07-09 DIAGNOSIS — E1165 Type 2 diabetes mellitus with hyperglycemia: Secondary | ICD-10-CM | POA: Diagnosis not present

## 2022-07-09 DIAGNOSIS — Z794 Long term (current) use of insulin: Secondary | ICD-10-CM | POA: Diagnosis not present

## 2022-07-09 DIAGNOSIS — H6523 Chronic serous otitis media, bilateral: Secondary | ICD-10-CM | POA: Diagnosis not present

## 2022-07-22 DIAGNOSIS — H6693 Otitis media, unspecified, bilateral: Secondary | ICD-10-CM | POA: Diagnosis not present

## 2022-07-22 DIAGNOSIS — Z9622 Myringotomy tube(s) status: Secondary | ICD-10-CM | POA: Diagnosis not present

## 2022-08-08 DIAGNOSIS — Z794 Long term (current) use of insulin: Secondary | ICD-10-CM | POA: Diagnosis not present

## 2022-08-08 DIAGNOSIS — E1165 Type 2 diabetes mellitus with hyperglycemia: Secondary | ICD-10-CM | POA: Diagnosis not present

## 2022-09-08 DIAGNOSIS — Z794 Long term (current) use of insulin: Secondary | ICD-10-CM | POA: Diagnosis not present

## 2022-09-08 DIAGNOSIS — E1165 Type 2 diabetes mellitus with hyperglycemia: Secondary | ICD-10-CM | POA: Diagnosis not present

## 2022-09-15 DIAGNOSIS — E042 Nontoxic multinodular goiter: Secondary | ICD-10-CM | POA: Diagnosis not present

## 2022-09-15 DIAGNOSIS — E1165 Type 2 diabetes mellitus with hyperglycemia: Secondary | ICD-10-CM | POA: Diagnosis not present

## 2022-09-15 DIAGNOSIS — E1122 Type 2 diabetes mellitus with diabetic chronic kidney disease: Secondary | ICD-10-CM | POA: Diagnosis not present

## 2022-09-15 DIAGNOSIS — N183 Chronic kidney disease, stage 3 unspecified: Secondary | ICD-10-CM | POA: Diagnosis not present

## 2022-09-15 DIAGNOSIS — R809 Proteinuria, unspecified: Secondary | ICD-10-CM | POA: Diagnosis not present

## 2022-09-15 DIAGNOSIS — Z9989 Dependence on other enabling machines and devices: Secondary | ICD-10-CM | POA: Diagnosis not present

## 2022-09-15 DIAGNOSIS — G4700 Insomnia, unspecified: Secondary | ICD-10-CM | POA: Diagnosis not present

## 2022-09-15 DIAGNOSIS — I10 Essential (primary) hypertension: Secondary | ICD-10-CM | POA: Diagnosis not present

## 2022-09-15 DIAGNOSIS — E782 Mixed hyperlipidemia: Secondary | ICD-10-CM | POA: Diagnosis not present

## 2022-09-23 DIAGNOSIS — E1122 Type 2 diabetes mellitus with diabetic chronic kidney disease: Secondary | ICD-10-CM | POA: Diagnosis not present

## 2022-09-23 DIAGNOSIS — F321 Major depressive disorder, single episode, moderate: Secondary | ICD-10-CM | POA: Diagnosis not present

## 2022-09-23 DIAGNOSIS — N183 Chronic kidney disease, stage 3 unspecified: Secondary | ICD-10-CM | POA: Diagnosis not present

## 2022-09-23 DIAGNOSIS — N1831 Chronic kidney disease, stage 3a: Secondary | ICD-10-CM | POA: Diagnosis not present

## 2022-09-23 DIAGNOSIS — D638 Anemia in other chronic diseases classified elsewhere: Secondary | ICD-10-CM | POA: Diagnosis not present

## 2022-09-23 DIAGNOSIS — Z794 Long term (current) use of insulin: Secondary | ICD-10-CM | POA: Diagnosis not present

## 2022-09-23 DIAGNOSIS — E785 Hyperlipidemia, unspecified: Secondary | ICD-10-CM | POA: Diagnosis not present

## 2022-09-23 DIAGNOSIS — M1990 Unspecified osteoarthritis, unspecified site: Secondary | ICD-10-CM | POA: Diagnosis not present

## 2022-09-23 DIAGNOSIS — I10 Essential (primary) hypertension: Secondary | ICD-10-CM | POA: Diagnosis not present

## 2022-10-08 DIAGNOSIS — E1165 Type 2 diabetes mellitus with hyperglycemia: Secondary | ICD-10-CM | POA: Diagnosis not present

## 2022-10-08 DIAGNOSIS — Z794 Long term (current) use of insulin: Secondary | ICD-10-CM | POA: Diagnosis not present

## 2022-11-07 DIAGNOSIS — Z794 Long term (current) use of insulin: Secondary | ICD-10-CM | POA: Diagnosis not present

## 2022-11-07 DIAGNOSIS — E1165 Type 2 diabetes mellitus with hyperglycemia: Secondary | ICD-10-CM | POA: Diagnosis not present

## 2022-11-13 DIAGNOSIS — G4700 Insomnia, unspecified: Secondary | ICD-10-CM | POA: Diagnosis not present

## 2022-11-13 DIAGNOSIS — M1990 Unspecified osteoarthritis, unspecified site: Secondary | ICD-10-CM | POA: Diagnosis not present

## 2022-11-17 DIAGNOSIS — H0100B Unspecified blepharitis left eye, upper and lower eyelids: Secondary | ICD-10-CM | POA: Diagnosis not present

## 2022-11-17 DIAGNOSIS — H0100A Unspecified blepharitis right eye, upper and lower eyelids: Secondary | ICD-10-CM | POA: Diagnosis not present

## 2022-12-08 DIAGNOSIS — N1832 Chronic kidney disease, stage 3b: Secondary | ICD-10-CM | POA: Diagnosis not present

## 2022-12-08 DIAGNOSIS — E1165 Type 2 diabetes mellitus with hyperglycemia: Secondary | ICD-10-CM | POA: Diagnosis not present

## 2022-12-08 DIAGNOSIS — Z794 Long term (current) use of insulin: Secondary | ICD-10-CM | POA: Diagnosis not present

## 2022-12-09 DIAGNOSIS — I129 Hypertensive chronic kidney disease with stage 1 through stage 4 chronic kidney disease, or unspecified chronic kidney disease: Secondary | ICD-10-CM | POA: Diagnosis not present

## 2022-12-09 DIAGNOSIS — N1832 Chronic kidney disease, stage 3b: Secondary | ICD-10-CM | POA: Diagnosis not present

## 2022-12-09 DIAGNOSIS — E1122 Type 2 diabetes mellitus with diabetic chronic kidney disease: Secondary | ICD-10-CM | POA: Diagnosis not present

## 2022-12-09 DIAGNOSIS — Z794 Long term (current) use of insulin: Secondary | ICD-10-CM | POA: Diagnosis not present

## 2022-12-09 DIAGNOSIS — E669 Obesity, unspecified: Secondary | ICD-10-CM | POA: Diagnosis not present

## 2022-12-16 DIAGNOSIS — I10 Essential (primary) hypertension: Secondary | ICD-10-CM | POA: Diagnosis not present

## 2022-12-16 DIAGNOSIS — E1165 Type 2 diabetes mellitus with hyperglycemia: Secondary | ICD-10-CM | POA: Diagnosis not present

## 2022-12-16 DIAGNOSIS — N2581 Secondary hyperparathyroidism of renal origin: Secondary | ICD-10-CM | POA: Diagnosis not present

## 2022-12-16 DIAGNOSIS — E042 Nontoxic multinodular goiter: Secondary | ICD-10-CM | POA: Diagnosis not present

## 2022-12-16 DIAGNOSIS — Z634 Disappearance and death of family member: Secondary | ICD-10-CM | POA: Diagnosis not present

## 2022-12-16 DIAGNOSIS — N183 Chronic kidney disease, stage 3 unspecified: Secondary | ICD-10-CM | POA: Diagnosis not present

## 2022-12-18 DIAGNOSIS — Z79899 Other long term (current) drug therapy: Secondary | ICD-10-CM | POA: Diagnosis not present

## 2022-12-18 DIAGNOSIS — M1990 Unspecified osteoarthritis, unspecified site: Secondary | ICD-10-CM | POA: Diagnosis not present

## 2022-12-18 DIAGNOSIS — G4700 Insomnia, unspecified: Secondary | ICD-10-CM | POA: Diagnosis not present

## 2022-12-22 DIAGNOSIS — E113292 Type 2 diabetes mellitus with mild nonproliferative diabetic retinopathy without macular edema, left eye: Secondary | ICD-10-CM | POA: Diagnosis not present

## 2023-01-07 DIAGNOSIS — Z794 Long term (current) use of insulin: Secondary | ICD-10-CM | POA: Diagnosis not present

## 2023-01-07 DIAGNOSIS — E1165 Type 2 diabetes mellitus with hyperglycemia: Secondary | ICD-10-CM | POA: Diagnosis not present

## 2023-01-20 DIAGNOSIS — H6693 Otitis media, unspecified, bilateral: Secondary | ICD-10-CM | POA: Diagnosis not present

## 2023-01-26 DIAGNOSIS — G8929 Other chronic pain: Secondary | ICD-10-CM | POA: Diagnosis not present

## 2023-01-26 DIAGNOSIS — M1712 Unilateral primary osteoarthritis, left knee: Secondary | ICD-10-CM | POA: Diagnosis not present

## 2023-01-26 DIAGNOSIS — M25562 Pain in left knee: Secondary | ICD-10-CM | POA: Diagnosis not present

## 2023-02-03 DIAGNOSIS — N183 Chronic kidney disease, stage 3 unspecified: Secondary | ICD-10-CM | POA: Diagnosis not present

## 2023-02-03 DIAGNOSIS — Z23 Encounter for immunization: Secondary | ICD-10-CM | POA: Diagnosis not present

## 2023-02-03 DIAGNOSIS — Z01818 Encounter for other preprocedural examination: Secondary | ICD-10-CM | POA: Diagnosis not present

## 2023-02-03 DIAGNOSIS — Z794 Long term (current) use of insulin: Secondary | ICD-10-CM | POA: Diagnosis not present

## 2023-02-03 DIAGNOSIS — M1990 Unspecified osteoarthritis, unspecified site: Secondary | ICD-10-CM | POA: Diagnosis not present

## 2023-02-03 DIAGNOSIS — N1831 Chronic kidney disease, stage 3a: Secondary | ICD-10-CM | POA: Diagnosis not present

## 2023-02-03 DIAGNOSIS — G4700 Insomnia, unspecified: Secondary | ICD-10-CM | POA: Diagnosis not present

## 2023-02-03 DIAGNOSIS — I10 Essential (primary) hypertension: Secondary | ICD-10-CM | POA: Diagnosis not present

## 2023-02-03 DIAGNOSIS — E1122 Type 2 diabetes mellitus with diabetic chronic kidney disease: Secondary | ICD-10-CM | POA: Diagnosis not present

## 2023-02-05 DIAGNOSIS — Z79899 Other long term (current) drug therapy: Secondary | ICD-10-CM | POA: Diagnosis not present

## 2023-02-05 DIAGNOSIS — R9389 Abnormal findings on diagnostic imaging of other specified body structures: Secondary | ICD-10-CM | POA: Diagnosis not present

## 2023-02-05 DIAGNOSIS — Z01818 Encounter for other preprocedural examination: Secondary | ICD-10-CM | POA: Diagnosis not present

## 2023-02-05 DIAGNOSIS — M79609 Pain in unspecified limb: Secondary | ICD-10-CM | POA: Diagnosis not present

## 2023-02-05 DIAGNOSIS — I700 Atherosclerosis of aorta: Secondary | ICD-10-CM | POA: Diagnosis not present

## 2023-02-05 DIAGNOSIS — E559 Vitamin D deficiency, unspecified: Secondary | ICD-10-CM | POA: Diagnosis not present

## 2023-02-06 DIAGNOSIS — E1165 Type 2 diabetes mellitus with hyperglycemia: Secondary | ICD-10-CM | POA: Diagnosis not present

## 2023-02-06 DIAGNOSIS — Z794 Long term (current) use of insulin: Secondary | ICD-10-CM | POA: Diagnosis not present

## 2023-02-10 ENCOUNTER — Telehealth: Payer: Self-pay | Admitting: *Deleted

## 2023-02-10 DIAGNOSIS — M1990 Unspecified osteoarthritis, unspecified site: Secondary | ICD-10-CM | POA: Insufficient documentation

## 2023-02-10 DIAGNOSIS — E785 Hyperlipidemia, unspecified: Secondary | ICD-10-CM | POA: Insufficient documentation

## 2023-02-10 DIAGNOSIS — K589 Irritable bowel syndrome without diarrhea: Secondary | ICD-10-CM | POA: Insufficient documentation

## 2023-02-10 NOTE — Telephone Encounter (Signed)
Pre-operative Risk Assessment    Patient Name: Kes Westmoreland Bortle  DOB: [birth year] MRN: [id number]      Request for Surgical Clearance    Procedure:   Left Total Knee Arthroplasty  Date of Surgery:  Clearance TBD                                 Surgeon:  Dr. Linda Hedges  Surgeon's Group or Practice Name:  [facility] Phone number:  [phone] Fax number:  [phone]   Type of Clearance Requested:   - Medical    Type of Anesthesia:  Not Indicated   Additional requests/questions:    SignedStacie Glaze   02/10/2023, 2:11 PM

## 2023-02-10 NOTE — Telephone Encounter (Signed)
Primary Cardiologist:Robert Bing Matter, MD  Chart reviewed as part of pre-operative protocol coverage. Because of Mary Norris's past medical history and time since last visit, he/she will require a follow-up visit in order to better assess preoperative cardiovascular risk.  Pre-op covering staff: - Patient has an appointment with Wallis Bamberg, NP on 02/13/23 at which time clearance can be addressed. Appointment notes have been updated.  - Please contact requesting surgeon's office via preferred method (i.e, phone, fax) to inform them of need for appointment prior to surgery.  There is no cardiac indication for aspirin.  Levi Aland, NP-C  02/10/2023, 4:22 PM [address], [address] Office [phone] Fax [phone]

## 2023-02-10 NOTE — Telephone Encounter (Signed)
I will update all parties involved pt has appt 02/13/23.

## 2023-02-13 ENCOUNTER — Encounter: Payer: Self-pay | Admitting: Cardiology

## 2023-02-13 ENCOUNTER — Ambulatory Visit: Payer: Medicare HMO | Attending: Cardiology | Admitting: Cardiology

## 2023-02-13 VITALS — BP 144/68 | HR 92 | Ht 61.0 in | Wt 178.4 lb

## 2023-02-13 DIAGNOSIS — R55 Syncope and collapse: Secondary | ICD-10-CM

## 2023-02-13 DIAGNOSIS — Z01810 Encounter for preprocedural cardiovascular examination: Secondary | ICD-10-CM

## 2023-02-13 DIAGNOSIS — E782 Mixed hyperlipidemia: Secondary | ICD-10-CM | POA: Diagnosis not present

## 2023-02-13 DIAGNOSIS — M8589 Other specified disorders of bone density and structure, multiple sites: Secondary | ICD-10-CM | POA: Diagnosis not present

## 2023-02-13 DIAGNOSIS — Z794 Long term (current) use of insulin: Secondary | ICD-10-CM | POA: Diagnosis not present

## 2023-02-13 DIAGNOSIS — I952 Hypotension due to drugs: Secondary | ICD-10-CM | POA: Diagnosis not present

## 2023-02-13 DIAGNOSIS — E119 Type 2 diabetes mellitus without complications: Secondary | ICD-10-CM | POA: Diagnosis not present

## 2023-02-13 DIAGNOSIS — I10 Essential (primary) hypertension: Secondary | ICD-10-CM

## 2023-02-13 DIAGNOSIS — Z1231 Encounter for screening mammogram for malignant neoplasm of breast: Secondary | ICD-10-CM | POA: Diagnosis not present

## 2023-02-13 MED ORDER — CARVEDILOL 6.25 MG PO TABS: 6.2500 mg | ORAL_TABLET | Freq: Two times a day (BID) | ORAL | 3 refills | Status: DC

## 2023-02-13 NOTE — Patient Instructions (Addendum)
Medication Instructions:    STOP: Propranolol  START: Carvedilol 6.25mg  1 tablet twice daily   Lab Work: None Ordered If you have labs (blood work) drawn today and your tests are completely normal, you will receive your results only by: MyChart Message (if you have MyChart) OR A paper copy in the mail If you have any lab test that is abnormal or we need to change your treatment, we will call you to review the results.   Testing/Procedures: Your physician has requested that you have a lexiscan myoview. For further information please visit [email or web address] Please follow instruction sheet, as given.  The test will take approximately 3 to 4 hours to complete; you may bring reading material.  If someone comes with you to your appointment, they will need to remain in the main lobby due to limited space in the testing area.   How to prepare for your Myocardial Perfusion Test: Do not eat or drink 3 hours prior to your test, except you may have water. Do not consume products containing caffeine (regular or decaffeinated) 12 hours prior to your test. (ex: coffee, chocolate, sodas, tea). Do bring a list of your current medications with you.  If not listed below, you may take your medications as normal. Do wear comfortable clothes (no dresses or overalls) and walking shoes, tennis shoes preferred (No heels or open toe shoes are allowed). Do NOT wear cologne, perfume, aftershave, or lotions (deodorant is allowed). If these instructions are not followed, your test will have to be rescheduled.     Follow-Up: At [facility], you and your health needs are our priority.  As part of our continuing mission to provide you with exceptional heart care, we have created designated Provider Care Teams.  These Care Teams include your primary Cardiologist (physician) and Advanced Practice Providers (APPs -  Physician Assistants and Nurse Practitioners) who all work together to provide you with the care  you need, when you need it.  We recommend signing up for the patient portal called "MyChart".  Sign up information is provided on this After Visit Summary.  MyChart is used to connect with patients for Virtual Visits (Telemedicine).  Patients are able to view lab/test results, encounter notes, upcoming appointments, etc.  Non-urgent messages can be sent to your provider as well.   To learn more about what you can do with MyChart, go to [email or web address].    Your next appointment:   6 month(s)  The format for your next appointment:   In Person with Dr. Bing Matter  Provider:   Wallis Bamberg, NP   Other Instructions NA

## 2023-02-13 NOTE — Progress Notes (Addendum)
Cardiology Office Note:  .   Date:  02/13/2023  ID:  Mary Norris, DOB [birth year], MRN [id number] PCP: Hurshel Party, NP  [facility] Providers Cardiologist:  Gypsy Balsam, MD    History of Present Illness: .   Mary Norris is a 77 y.o. female with a past medical history of hypertension, first-degree AV block, migraines, CKD stage III, GERD, IBS, DM 2, dyslipidemia, anemia.  02/25/2022 monitor average heart rate 71 bpm, predominant rhythm was sinus, first-degree AV block was present, no pauses greater than 3 seconds, 1 run of SVT, isolated VE's were rare. 06/28/2021 echocardiogram  EF 60 to 65%, mild concentric LVH, impaired relaxation, mild aortic valve sclerosis without stenosis, trace MR, trace TR.  Most recently evaluated by Dr. Dulce Sellar on 05/28/2022.  She had been followed for syncopal episode, had significant orthostatic changes with her blood pressure and was taking apparently to tricyclic antidepressants as well as amlodipine.  An event monitor was arranged revealing no bradycardia, prolonged pauses that would account for syncope.  Her TCA was stopped, she had had no further episodes of lightheadedness, she was checking her blood pressure at home.  She presents today for preoperative evaluation for upcoming knee replacement, currently using a cane for ambulation.  She denies any cardiac complaints.  Denies chest pain, shortness of breath, palpitations, dizziness, sudden weight gain.  Her blood pressure is mildly elevated in the office today as it has been in the last few office visits.  She offers no formal complaints outside of her knee pain, due to this she is living a sedentary lifestyle right now.   ROS: Review of Systems  Musculoskeletal:  Positive for joint pain.  All other systems reviewed and are negative.    Studies Reviewed: Marland Kitchen   EKG Interpretation Date/Time:  Friday February 13 2023 14:17:01 EDT Ventricular Rate:  92 PR Interval:  168 QRS  Duration:  90 QT Interval:  342 QTC Calculation: 422 R Axis:   112  Text Interpretation: Normal sinus rhythm with sinus arrhythmia Right axis deviation Anterior infarct , age undetermined Abnormal ECG Confirmed by Wallis Bamberg [phone]) on 02/13/2023 3:20:02 PM    Cardiac Studies & Procedures         MONITORS  LONG TERM MONITOR (3-14 DAYS) 03/18/2022  Narrative Patch Wear Time:  12 days and 0 hours (2023-10-31T12:01:48-0400 to 2023-11-12T11:39:33-0500)  Patient had a min HR of 54 bpm, max HR of 143 bpm, and avg HR of 71 bpm. Predominant underlying rhythm was Sinus Rhythm. First Degree AV Block was present.  There were no pauses of 3 seconds or greater and no episodes of second or third-degree AV nodal block.  There were 3 diary events to such poor quality that the strip is uninterpretable 1 associated with sinus rhythm.  1 run of Supraventricular Tachycardia occurred lasting 5 beats with a max rate of 143 bpm (avg 119 bpm). Isolated SVEs were rare (<1.0%), SVE Couplets were rare (<1.0%), and no SVE Triplets were present.  There were no episodes of atrial fibrillation or flutter.  Isolated VEs were rare (<1.0%), and no VE Couplets or VE Triplets were present.           Risk Assessment/Calculations:     HYPERTENSION CONTROL Vitals:   02/13/23 1413 02/13/23 1525  BP: (!) 146/74 (!) 144/68    The patient's blood pressure is elevated above target today.  In order to address the patient's elevated BP: A current anti-hypertensive medication was adjusted today.  Physical Exam:   VS:  BP (!) 144/68   Pulse 92   Ht 5\' 1"  (1.549 m)   Wt 178 lb 6.4 oz (80.9 kg)   SpO2 95%   BMI 33.71 kg/m    Wt Readings from Last 3 Encounters:  02/13/23 178 lb 6.4 oz (80.9 kg)  02/03/23 177 lb (80.3 kg)  05/28/22 192 lb (87.1 kg)    GEN: Well nourished, well developed in no acute distress NECK: No JVD; No carotid bruits CARDIAC: RRR, no murmurs, rubs, gallops RESPIRATORY:  Clear  to auscultation without rales, wheezing or rhonchi  ABDOMEN: Soft, non-tender, non-distended EXTREMITIES:  No edema; No deformity   ASSESSMENT AND PLAN: .   Syncope and collapse-no further episodes after stopping TCA, monitor and echo were unrevealing as well.   Hypotension-this was felt to be secondary to tricyclic antidepressant along with amlodipine.   Hypertension - BP today is elevated today at 146/76, and has been at other OV recently. Will stop her propranolol, will start Carvedilol 6.25 mg BID, continue lisinopril 40 mg daily, continue Norvasc 10 mg daily, continue HCTZ 12.5 mg daily  Preoperative cardiovascular evaluation -unfortunately she is not able to meet greater than 4 METS of physical activity, this is likely directly related to her knee pain however she has comorbid conditions as well.  Need to complete a stress evaluation on her prior to clearing her for her upcoming TKR.  **Addendum-she underwent an ischemic evaluation with a Lexiscan, this was a normal, low risk study.  She is able to proceed for her upcoming knee replacement in an acceptable risk.  Regarding her aspirin, we prefer that this is continued through the perioperative procedure however if deemed to be to have a risk of bleeding per the surgeon this can be held 7 days prior to her knee replacement and resumed once hemostasis has occurred and once cleared by the surgeon.  Informed Consent   Shared Decision Making/Informed Consent The risks [chest pain, shortness of breath, cardiac arrhythmias, dizziness, blood pressure fluctuations, myocardial infarction, stroke/transient ischemic attack, nausea, vomiting, allergic reaction, radiation exposure, metallic taste sensation and life-threatening complications (estimated to be 1 in 10,000)], benefits (risk stratification, diagnosing coronary artery disease, treatment guidance) and alternatives of a nuclear stress test were discussed in detail with Mary Norris and she agrees  to proceed.     Dispo: Stop propranolol, start carvedilol 6.25 mg twice daily, Lexiscan.  Follow-up in 6 months  Signed, Flossie Dibble, NP

## 2023-02-18 ENCOUNTER — Ambulatory Visit: Payer: Medicare HMO

## 2023-02-18 ENCOUNTER — Telehealth (HOSPITAL_COMMUNITY): Payer: Self-pay | Admitting: *Deleted

## 2023-02-18 NOTE — Telephone Encounter (Signed)
Patient given detailed instructions per Myocardial Perfusion Study Information Sheet for the test on 02/25/2023 at 11:00. Patient notified to arrive 15 minutes early and that it is imperative to arrive on time for appointment to keep from having the test rescheduled.  If you need to cancel or reschedule your appointment, please call the office within 24 hours of your appointment. . Patient verbalized understanding.Mary Norris

## 2023-02-24 ENCOUNTER — Encounter: Payer: Self-pay | Admitting: Orthopedic Surgery

## 2023-02-25 ENCOUNTER — Ambulatory Visit: Payer: Medicare HMO | Attending: Cardiology

## 2023-02-25 DIAGNOSIS — Z01810 Encounter for preprocedural cardiovascular examination: Secondary | ICD-10-CM | POA: Diagnosis not present

## 2023-02-25 LAB — MYOCARDIAL PERFUSION IMAGING
LV dias vol: 78 mL (ref 46–106)
LV sys vol: 27 mL
Nuc Stress EF: 65 %
Peak HR: 106 {beats}/min
Rest HR: 81 {beats}/min
Rest Nuclear Isotope Dose: 8.3 mCi
SDS: 1
SRS: 2
SSS: 3
Stress Nuclear Isotope Dose: 24.5 mCi
TID: 1.1

## 2023-02-25 MED ORDER — REGADENOSON 0.4 MG/5ML IV SOLN
0.4000 mg | Freq: Once | INTRAVENOUS | Status: AC
Start: 2023-02-25 — End: 2023-02-25
  Administered 2023-02-25: 0.4 mg via INTRAVENOUS

## 2023-02-25 MED ORDER — TECHNETIUM TC 99M TETROFOSMIN IV KIT
8.3000 | PACK | Freq: Once | INTRAVENOUS | Status: AC | PRN
  Administered 2023-02-25: 8.3 via INTRAVENOUS

## 2023-02-25 MED ORDER — TECHNETIUM TC 99M TETROFOSMIN IV KIT
24.5000 | PACK | Freq: Once | INTRAVENOUS | Status: AC | PRN
Start: 2023-02-25 — End: 2023-02-25
  Administered 2023-02-25: 24.5 via INTRAVENOUS

## 2023-02-27 ENCOUNTER — Telehealth: Payer: Self-pay

## 2023-02-27 NOTE — Telephone Encounter (Signed)
Results reviewed with pt as per [facility] note.  Pt verbalized understanding and had no additional questions. Routed to PCP.

## 2023-03-09 DIAGNOSIS — E1165 Type 2 diabetes mellitus with hyperglycemia: Secondary | ICD-10-CM | POA: Diagnosis not present

## 2023-03-09 DIAGNOSIS — Z794 Long term (current) use of insulin: Secondary | ICD-10-CM | POA: Diagnosis not present

## 2023-03-10 DIAGNOSIS — I10 Essential (primary) hypertension: Secondary | ICD-10-CM | POA: Diagnosis not present

## 2023-03-10 DIAGNOSIS — Z79899 Other long term (current) drug therapy: Secondary | ICD-10-CM | POA: Diagnosis not present

## 2023-03-10 DIAGNOSIS — E1122 Type 2 diabetes mellitus with diabetic chronic kidney disease: Secondary | ICD-10-CM | POA: Diagnosis not present

## 2023-03-10 DIAGNOSIS — M1990 Unspecified osteoarthritis, unspecified site: Secondary | ICD-10-CM | POA: Diagnosis not present

## 2023-03-10 DIAGNOSIS — Z794 Long term (current) use of insulin: Secondary | ICD-10-CM | POA: Diagnosis not present

## 2023-03-10 DIAGNOSIS — G4700 Insomnia, unspecified: Secondary | ICD-10-CM | POA: Diagnosis not present

## 2023-03-10 DIAGNOSIS — Z01818 Encounter for other preprocedural examination: Secondary | ICD-10-CM | POA: Diagnosis not present

## 2023-03-10 DIAGNOSIS — N183 Chronic kidney disease, stage 3 unspecified: Secondary | ICD-10-CM | POA: Diagnosis not present

## 2023-03-10 DIAGNOSIS — N1831 Chronic kidney disease, stage 3a: Secondary | ICD-10-CM | POA: Diagnosis not present

## 2023-04-01 DIAGNOSIS — R52 Pain, unspecified: Secondary | ICD-10-CM | POA: Diagnosis not present

## 2023-04-01 DIAGNOSIS — N1832 Chronic kidney disease, stage 3b: Secondary | ICD-10-CM | POA: Diagnosis not present

## 2023-04-01 DIAGNOSIS — Z79899 Other long term (current) drug therapy: Secondary | ICD-10-CM | POA: Diagnosis not present

## 2023-04-01 DIAGNOSIS — M25562 Pain in left knee: Secondary | ICD-10-CM | POA: Diagnosis not present

## 2023-04-01 DIAGNOSIS — M79609 Pain in unspecified limb: Secondary | ICD-10-CM | POA: Diagnosis not present

## 2023-04-08 DIAGNOSIS — E1165 Type 2 diabetes mellitus with hyperglycemia: Secondary | ICD-10-CM | POA: Diagnosis not present

## 2023-04-08 DIAGNOSIS — N1832 Chronic kidney disease, stage 3b: Secondary | ICD-10-CM | POA: Diagnosis not present

## 2023-04-08 DIAGNOSIS — Z794 Long term (current) use of insulin: Secondary | ICD-10-CM | POA: Diagnosis not present

## 2023-04-08 DIAGNOSIS — I129 Hypertensive chronic kidney disease with stage 1 through stage 4 chronic kidney disease, or unspecified chronic kidney disease: Secondary | ICD-10-CM | POA: Diagnosis not present

## 2023-04-08 DIAGNOSIS — E1122 Type 2 diabetes mellitus with diabetic chronic kidney disease: Secondary | ICD-10-CM | POA: Diagnosis not present

## 2023-04-08 DIAGNOSIS — E669 Obesity, unspecified: Secondary | ICD-10-CM | POA: Diagnosis not present

## 2023-04-09 DIAGNOSIS — E669 Obesity, unspecified: Secondary | ICD-10-CM | POA: Diagnosis not present

## 2023-04-09 DIAGNOSIS — I10 Essential (primary) hypertension: Secondary | ICD-10-CM | POA: Diagnosis not present

## 2023-04-09 DIAGNOSIS — E559 Vitamin D deficiency, unspecified: Secondary | ICD-10-CM | POA: Diagnosis not present

## 2023-04-09 DIAGNOSIS — E042 Nontoxic multinodular goiter: Secondary | ICD-10-CM | POA: Diagnosis not present

## 2023-04-09 DIAGNOSIS — E1165 Type 2 diabetes mellitus with hyperglycemia: Secondary | ICD-10-CM | POA: Diagnosis not present

## 2023-04-09 DIAGNOSIS — N2581 Secondary hyperparathyroidism of renal origin: Secondary | ICD-10-CM | POA: Diagnosis not present

## 2023-04-09 DIAGNOSIS — D649 Anemia, unspecified: Secondary | ICD-10-CM | POA: Diagnosis not present

## 2023-04-09 DIAGNOSIS — N183 Chronic kidney disease, stage 3 unspecified: Secondary | ICD-10-CM | POA: Diagnosis not present

## 2023-04-09 DIAGNOSIS — R809 Proteinuria, unspecified: Secondary | ICD-10-CM | POA: Diagnosis not present

## 2023-04-14 DIAGNOSIS — Z6831 Body mass index (BMI) 31.0-31.9, adult: Secondary | ICD-10-CM | POA: Diagnosis not present

## 2023-04-14 DIAGNOSIS — M1990 Unspecified osteoarthritis, unspecified site: Secondary | ICD-10-CM | POA: Diagnosis not present

## 2023-04-14 DIAGNOSIS — I10 Essential (primary) hypertension: Secondary | ICD-10-CM | POA: Diagnosis not present

## 2023-04-23 DIAGNOSIS — N183 Chronic kidney disease, stage 3 unspecified: Secondary | ICD-10-CM | POA: Diagnosis not present

## 2023-04-23 DIAGNOSIS — G8918 Other acute postprocedural pain: Secondary | ICD-10-CM | POA: Diagnosis not present

## 2023-04-23 DIAGNOSIS — M1712 Unilateral primary osteoarthritis, left knee: Secondary | ICD-10-CM | POA: Diagnosis not present

## 2023-04-23 DIAGNOSIS — Z794 Long term (current) use of insulin: Secondary | ICD-10-CM | POA: Diagnosis not present

## 2023-04-23 DIAGNOSIS — Z96652 Presence of left artificial knee joint: Secondary | ICD-10-CM | POA: Diagnosis not present

## 2023-04-23 DIAGNOSIS — Z79899 Other long term (current) drug therapy: Secondary | ICD-10-CM | POA: Diagnosis not present

## 2023-04-23 DIAGNOSIS — Z471 Aftercare following joint replacement surgery: Secondary | ICD-10-CM | POA: Diagnosis not present

## 2023-04-23 DIAGNOSIS — Z7982 Long term (current) use of aspirin: Secondary | ICD-10-CM | POA: Diagnosis not present

## 2023-04-23 DIAGNOSIS — I10 Essential (primary) hypertension: Secondary | ICD-10-CM | POA: Diagnosis not present

## 2023-04-23 DIAGNOSIS — I129 Hypertensive chronic kidney disease with stage 1 through stage 4 chronic kidney disease, or unspecified chronic kidney disease: Secondary | ICD-10-CM | POA: Diagnosis not present

## 2023-04-23 DIAGNOSIS — Z6833 Body mass index (BMI) 33.0-33.9, adult: Secondary | ICD-10-CM | POA: Diagnosis not present

## 2023-04-24 DIAGNOSIS — Z794 Long term (current) use of insulin: Secondary | ICD-10-CM | POA: Diagnosis not present

## 2023-04-24 DIAGNOSIS — N183 Chronic kidney disease, stage 3 unspecified: Secondary | ICD-10-CM | POA: Diagnosis not present

## 2023-04-24 DIAGNOSIS — M1712 Unilateral primary osteoarthritis, left knee: Secondary | ICD-10-CM | POA: Diagnosis not present

## 2023-04-24 DIAGNOSIS — I129 Hypertensive chronic kidney disease with stage 1 through stage 4 chronic kidney disease, or unspecified chronic kidney disease: Secondary | ICD-10-CM | POA: Diagnosis not present

## 2023-04-24 DIAGNOSIS — Z79899 Other long term (current) drug therapy: Secondary | ICD-10-CM | POA: Diagnosis not present

## 2023-04-24 DIAGNOSIS — Z7982 Long term (current) use of aspirin: Secondary | ICD-10-CM | POA: Diagnosis not present

## 2023-04-24 DIAGNOSIS — Z6833 Body mass index (BMI) 33.0-33.9, adult: Secondary | ICD-10-CM | POA: Diagnosis not present

## 2023-04-25 DIAGNOSIS — I129 Hypertensive chronic kidney disease with stage 1 through stage 4 chronic kidney disease, or unspecified chronic kidney disease: Secondary | ICD-10-CM | POA: Diagnosis not present

## 2023-04-25 DIAGNOSIS — Z7982 Long term (current) use of aspirin: Secondary | ICD-10-CM | POA: Diagnosis not present

## 2023-04-25 DIAGNOSIS — M1712 Unilateral primary osteoarthritis, left knee: Secondary | ICD-10-CM | POA: Diagnosis not present

## 2023-04-25 DIAGNOSIS — Z79899 Other long term (current) drug therapy: Secondary | ICD-10-CM | POA: Diagnosis not present

## 2023-04-25 DIAGNOSIS — Z6833 Body mass index (BMI) 33.0-33.9, adult: Secondary | ICD-10-CM | POA: Diagnosis not present

## 2023-04-25 DIAGNOSIS — Z794 Long term (current) use of insulin: Secondary | ICD-10-CM | POA: Diagnosis not present

## 2023-04-25 DIAGNOSIS — N183 Chronic kidney disease, stage 3 unspecified: Secondary | ICD-10-CM | POA: Diagnosis not present

## 2023-04-27 DIAGNOSIS — I129 Hypertensive chronic kidney disease with stage 1 through stage 4 chronic kidney disease, or unspecified chronic kidney disease: Secondary | ICD-10-CM | POA: Diagnosis not present

## 2023-04-27 DIAGNOSIS — F321 Major depressive disorder, single episode, moderate: Secondary | ICD-10-CM | POA: Diagnosis not present

## 2023-04-27 DIAGNOSIS — M1711 Unilateral primary osteoarthritis, right knee: Secondary | ICD-10-CM | POA: Diagnosis not present

## 2023-04-27 DIAGNOSIS — E1122 Type 2 diabetes mellitus with diabetic chronic kidney disease: Secondary | ICD-10-CM | POA: Diagnosis not present

## 2023-04-27 DIAGNOSIS — F419 Anxiety disorder, unspecified: Secondary | ICD-10-CM | POA: Diagnosis not present

## 2023-04-27 DIAGNOSIS — N1831 Chronic kidney disease, stage 3a: Secondary | ICD-10-CM | POA: Diagnosis not present

## 2023-04-27 DIAGNOSIS — Z471 Aftercare following joint replacement surgery: Secondary | ICD-10-CM | POA: Diagnosis not present

## 2023-04-27 DIAGNOSIS — D631 Anemia in chronic kidney disease: Secondary | ICD-10-CM | POA: Diagnosis not present

## 2023-04-28 DIAGNOSIS — N1831 Chronic kidney disease, stage 3a: Secondary | ICD-10-CM | POA: Diagnosis not present

## 2023-04-28 DIAGNOSIS — F321 Major depressive disorder, single episode, moderate: Secondary | ICD-10-CM | POA: Diagnosis not present

## 2023-04-28 DIAGNOSIS — Z471 Aftercare following joint replacement surgery: Secondary | ICD-10-CM | POA: Diagnosis not present

## 2023-04-28 DIAGNOSIS — D631 Anemia in chronic kidney disease: Secondary | ICD-10-CM | POA: Diagnosis not present

## 2023-04-28 DIAGNOSIS — E1122 Type 2 diabetes mellitus with diabetic chronic kidney disease: Secondary | ICD-10-CM | POA: Diagnosis not present

## 2023-04-28 DIAGNOSIS — F419 Anxiety disorder, unspecified: Secondary | ICD-10-CM | POA: Diagnosis not present

## 2023-04-28 DIAGNOSIS — I129 Hypertensive chronic kidney disease with stage 1 through stage 4 chronic kidney disease, or unspecified chronic kidney disease: Secondary | ICD-10-CM | POA: Diagnosis not present

## 2023-04-28 DIAGNOSIS — M1711 Unilateral primary osteoarthritis, right knee: Secondary | ICD-10-CM | POA: Diagnosis not present

## 2023-04-30 DIAGNOSIS — F321 Major depressive disorder, single episode, moderate: Secondary | ICD-10-CM | POA: Diagnosis not present

## 2023-04-30 DIAGNOSIS — I129 Hypertensive chronic kidney disease with stage 1 through stage 4 chronic kidney disease, or unspecified chronic kidney disease: Secondary | ICD-10-CM | POA: Diagnosis not present

## 2023-04-30 DIAGNOSIS — D631 Anemia in chronic kidney disease: Secondary | ICD-10-CM | POA: Diagnosis not present

## 2023-04-30 DIAGNOSIS — Z471 Aftercare following joint replacement surgery: Secondary | ICD-10-CM | POA: Diagnosis not present

## 2023-04-30 DIAGNOSIS — F419 Anxiety disorder, unspecified: Secondary | ICD-10-CM | POA: Diagnosis not present

## 2023-04-30 DIAGNOSIS — M1711 Unilateral primary osteoarthritis, right knee: Secondary | ICD-10-CM | POA: Diagnosis not present

## 2023-04-30 DIAGNOSIS — N1831 Chronic kidney disease, stage 3a: Secondary | ICD-10-CM | POA: Diagnosis not present

## 2023-04-30 DIAGNOSIS — E1122 Type 2 diabetes mellitus with diabetic chronic kidney disease: Secondary | ICD-10-CM | POA: Diagnosis not present

## 2023-05-01 DIAGNOSIS — M1711 Unilateral primary osteoarthritis, right knee: Secondary | ICD-10-CM | POA: Diagnosis not present

## 2023-05-01 DIAGNOSIS — D631 Anemia in chronic kidney disease: Secondary | ICD-10-CM | POA: Diagnosis not present

## 2023-05-01 DIAGNOSIS — F321 Major depressive disorder, single episode, moderate: Secondary | ICD-10-CM | POA: Diagnosis not present

## 2023-05-01 DIAGNOSIS — I129 Hypertensive chronic kidney disease with stage 1 through stage 4 chronic kidney disease, or unspecified chronic kidney disease: Secondary | ICD-10-CM | POA: Diagnosis not present

## 2023-05-01 DIAGNOSIS — N1831 Chronic kidney disease, stage 3a: Secondary | ICD-10-CM | POA: Diagnosis not present

## 2023-05-01 DIAGNOSIS — Z471 Aftercare following joint replacement surgery: Secondary | ICD-10-CM | POA: Diagnosis not present

## 2023-05-01 DIAGNOSIS — F419 Anxiety disorder, unspecified: Secondary | ICD-10-CM | POA: Diagnosis not present

## 2023-05-01 DIAGNOSIS — E1122 Type 2 diabetes mellitus with diabetic chronic kidney disease: Secondary | ICD-10-CM | POA: Diagnosis not present

## 2023-05-04 DIAGNOSIS — N1831 Chronic kidney disease, stage 3a: Secondary | ICD-10-CM | POA: Diagnosis not present

## 2023-05-04 DIAGNOSIS — Z471 Aftercare following joint replacement surgery: Secondary | ICD-10-CM | POA: Diagnosis not present

## 2023-05-04 DIAGNOSIS — E1122 Type 2 diabetes mellitus with diabetic chronic kidney disease: Secondary | ICD-10-CM | POA: Diagnosis not present

## 2023-05-04 DIAGNOSIS — I129 Hypertensive chronic kidney disease with stage 1 through stage 4 chronic kidney disease, or unspecified chronic kidney disease: Secondary | ICD-10-CM | POA: Diagnosis not present

## 2023-05-04 DIAGNOSIS — F321 Major depressive disorder, single episode, moderate: Secondary | ICD-10-CM | POA: Diagnosis not present

## 2023-05-04 DIAGNOSIS — D631 Anemia in chronic kidney disease: Secondary | ICD-10-CM | POA: Diagnosis not present

## 2023-05-04 DIAGNOSIS — F419 Anxiety disorder, unspecified: Secondary | ICD-10-CM | POA: Diagnosis not present

## 2023-05-04 DIAGNOSIS — M1711 Unilateral primary osteoarthritis, right knee: Secondary | ICD-10-CM | POA: Diagnosis not present

## 2023-05-05 DIAGNOSIS — F419 Anxiety disorder, unspecified: Secondary | ICD-10-CM | POA: Diagnosis not present

## 2023-05-05 DIAGNOSIS — N1831 Chronic kidney disease, stage 3a: Secondary | ICD-10-CM | POA: Diagnosis not present

## 2023-05-05 DIAGNOSIS — D631 Anemia in chronic kidney disease: Secondary | ICD-10-CM | POA: Diagnosis not present

## 2023-05-05 DIAGNOSIS — I129 Hypertensive chronic kidney disease with stage 1 through stage 4 chronic kidney disease, or unspecified chronic kidney disease: Secondary | ICD-10-CM | POA: Diagnosis not present

## 2023-05-05 DIAGNOSIS — Z471 Aftercare following joint replacement surgery: Secondary | ICD-10-CM | POA: Diagnosis not present

## 2023-05-05 DIAGNOSIS — F321 Major depressive disorder, single episode, moderate: Secondary | ICD-10-CM | POA: Diagnosis not present

## 2023-05-05 DIAGNOSIS — E1122 Type 2 diabetes mellitus with diabetic chronic kidney disease: Secondary | ICD-10-CM | POA: Diagnosis not present

## 2023-05-05 DIAGNOSIS — M1711 Unilateral primary osteoarthritis, right knee: Secondary | ICD-10-CM | POA: Diagnosis not present

## 2023-05-08 DIAGNOSIS — Z794 Long term (current) use of insulin: Secondary | ICD-10-CM | POA: Diagnosis not present

## 2023-05-08 DIAGNOSIS — E1165 Type 2 diabetes mellitus with hyperglycemia: Secondary | ICD-10-CM | POA: Diagnosis not present

## 2023-05-11 DIAGNOSIS — M25662 Stiffness of left knee, not elsewhere classified: Secondary | ICD-10-CM | POA: Diagnosis not present

## 2023-05-11 DIAGNOSIS — M25462 Effusion, left knee: Secondary | ICD-10-CM | POA: Diagnosis not present

## 2023-05-11 DIAGNOSIS — M25562 Pain in left knee: Secondary | ICD-10-CM | POA: Diagnosis not present

## 2023-05-11 DIAGNOSIS — R2689 Other abnormalities of gait and mobility: Secondary | ICD-10-CM | POA: Diagnosis not present

## 2023-05-26 DIAGNOSIS — M1990 Unspecified osteoarthritis, unspecified site: Secondary | ICD-10-CM | POA: Diagnosis not present

## 2023-05-26 DIAGNOSIS — N1831 Chronic kidney disease, stage 3a: Secondary | ICD-10-CM | POA: Diagnosis not present

## 2023-05-26 DIAGNOSIS — E1122 Type 2 diabetes mellitus with diabetic chronic kidney disease: Secondary | ICD-10-CM | POA: Diagnosis not present

## 2023-05-26 DIAGNOSIS — N183 Chronic kidney disease, stage 3 unspecified: Secondary | ICD-10-CM | POA: Diagnosis not present

## 2023-05-26 DIAGNOSIS — Z6829 Body mass index (BMI) 29.0-29.9, adult: Secondary | ICD-10-CM | POA: Diagnosis not present

## 2023-05-26 DIAGNOSIS — I10 Essential (primary) hypertension: Secondary | ICD-10-CM | POA: Diagnosis not present

## 2023-05-26 DIAGNOSIS — D638 Anemia in other chronic diseases classified elsewhere: Secondary | ICD-10-CM | POA: Diagnosis not present

## 2023-05-26 DIAGNOSIS — E875 Hyperkalemia: Secondary | ICD-10-CM | POA: Diagnosis not present

## 2023-05-26 DIAGNOSIS — Z79899 Other long term (current) drug therapy: Secondary | ICD-10-CM | POA: Diagnosis not present

## 2023-06-03 DIAGNOSIS — M1712 Unilateral primary osteoarthritis, left knee: Secondary | ICD-10-CM | POA: Diagnosis not present

## 2023-06-08 DIAGNOSIS — E1165 Type 2 diabetes mellitus with hyperglycemia: Secondary | ICD-10-CM | POA: Diagnosis not present

## 2023-06-08 DIAGNOSIS — Z794 Long term (current) use of insulin: Secondary | ICD-10-CM | POA: Diagnosis not present

## 2023-06-24 DIAGNOSIS — E119 Type 2 diabetes mellitus without complications: Secondary | ICD-10-CM | POA: Diagnosis not present

## 2023-06-26 DIAGNOSIS — I10 Essential (primary) hypertension: Secondary | ICD-10-CM | POA: Diagnosis not present

## 2023-06-26 DIAGNOSIS — Z139 Encounter for screening, unspecified: Secondary | ICD-10-CM | POA: Diagnosis not present

## 2023-06-26 DIAGNOSIS — E875 Hyperkalemia: Secondary | ICD-10-CM | POA: Diagnosis not present

## 2023-06-26 DIAGNOSIS — D638 Anemia in other chronic diseases classified elsewhere: Secondary | ICD-10-CM | POA: Diagnosis not present

## 2023-06-26 DIAGNOSIS — M1990 Unspecified osteoarthritis, unspecified site: Secondary | ICD-10-CM | POA: Diagnosis not present

## 2023-07-08 DIAGNOSIS — E1165 Type 2 diabetes mellitus with hyperglycemia: Secondary | ICD-10-CM | POA: Diagnosis not present

## 2023-07-08 DIAGNOSIS — Z794 Long term (current) use of insulin: Secondary | ICD-10-CM | POA: Diagnosis not present

## 2023-07-20 DIAGNOSIS — H9191 Unspecified hearing loss, right ear: Secondary | ICD-10-CM | POA: Diagnosis not present

## 2023-07-20 DIAGNOSIS — T161XXA Foreign body in right ear, initial encounter: Secondary | ICD-10-CM | POA: Diagnosis not present

## 2023-07-20 DIAGNOSIS — Z9622 Myringotomy tube(s) status: Secondary | ICD-10-CM | POA: Diagnosis not present

## 2023-07-20 DIAGNOSIS — H7291 Unspecified perforation of tympanic membrane, right ear: Secondary | ICD-10-CM | POA: Diagnosis not present

## 2023-07-24 DIAGNOSIS — I10 Essential (primary) hypertension: Secondary | ICD-10-CM | POA: Diagnosis not present

## 2023-07-24 DIAGNOSIS — M1990 Unspecified osteoarthritis, unspecified site: Secondary | ICD-10-CM | POA: Diagnosis not present

## 2023-08-07 DIAGNOSIS — Z794 Long term (current) use of insulin: Secondary | ICD-10-CM | POA: Diagnosis not present

## 2023-08-07 DIAGNOSIS — E1165 Type 2 diabetes mellitus with hyperglycemia: Secondary | ICD-10-CM | POA: Diagnosis not present

## 2023-08-10 DIAGNOSIS — I10 Essential (primary) hypertension: Secondary | ICD-10-CM | POA: Diagnosis not present

## 2023-08-10 DIAGNOSIS — E042 Nontoxic multinodular goiter: Secondary | ICD-10-CM | POA: Diagnosis not present

## 2023-08-10 DIAGNOSIS — E1165 Type 2 diabetes mellitus with hyperglycemia: Secondary | ICD-10-CM | POA: Diagnosis not present

## 2023-08-10 DIAGNOSIS — R634 Abnormal weight loss: Secondary | ICD-10-CM | POA: Diagnosis not present

## 2023-08-10 DIAGNOSIS — D631 Anemia in chronic kidney disease: Secondary | ICD-10-CM | POA: Diagnosis not present

## 2023-08-10 DIAGNOSIS — R809 Proteinuria, unspecified: Secondary | ICD-10-CM | POA: Diagnosis not present

## 2023-08-10 DIAGNOSIS — N183 Chronic kidney disease, stage 3 unspecified: Secondary | ICD-10-CM | POA: Diagnosis not present

## 2023-08-10 DIAGNOSIS — E782 Mixed hyperlipidemia: Secondary | ICD-10-CM | POA: Diagnosis not present

## 2023-08-10 DIAGNOSIS — N1832 Chronic kidney disease, stage 3b: Secondary | ICD-10-CM | POA: Diagnosis not present

## 2023-08-12 DIAGNOSIS — N1832 Chronic kidney disease, stage 3b: Secondary | ICD-10-CM | POA: Diagnosis not present

## 2023-08-12 DIAGNOSIS — E1122 Type 2 diabetes mellitus with diabetic chronic kidney disease: Secondary | ICD-10-CM | POA: Diagnosis not present

## 2023-08-12 DIAGNOSIS — Z794 Long term (current) use of insulin: Secondary | ICD-10-CM | POA: Diagnosis not present

## 2023-08-12 DIAGNOSIS — I129 Hypertensive chronic kidney disease with stage 1 through stage 4 chronic kidney disease, or unspecified chronic kidney disease: Secondary | ICD-10-CM | POA: Diagnosis not present

## 2023-08-28 ENCOUNTER — Encounter: Payer: Self-pay | Admitting: Cardiology

## 2023-08-31 ENCOUNTER — Encounter: Payer: Self-pay | Admitting: Cardiology

## 2023-08-31 ENCOUNTER — Ambulatory Visit: Attending: Cardiology | Admitting: Cardiology

## 2023-08-31 VITALS — BP 126/74 | HR 81 | Ht 62.0 in | Wt 154.4 lb

## 2023-08-31 DIAGNOSIS — I129 Hypertensive chronic kidney disease with stage 1 through stage 4 chronic kidney disease, or unspecified chronic kidney disease: Secondary | ICD-10-CM | POA: Diagnosis not present

## 2023-08-31 DIAGNOSIS — N183 Chronic kidney disease, stage 3 unspecified: Secondary | ICD-10-CM | POA: Diagnosis not present

## 2023-08-31 DIAGNOSIS — E782 Mixed hyperlipidemia: Secondary | ICD-10-CM | POA: Diagnosis not present

## 2023-08-31 DIAGNOSIS — E1169 Type 2 diabetes mellitus with other specified complication: Secondary | ICD-10-CM

## 2023-08-31 DIAGNOSIS — E785 Hyperlipidemia, unspecified: Secondary | ICD-10-CM

## 2023-08-31 DIAGNOSIS — R634 Abnormal weight loss: Secondary | ICD-10-CM | POA: Insufficient documentation

## 2023-08-31 NOTE — Patient Instructions (Signed)

## 2023-08-31 NOTE — Progress Notes (Signed)
 Cardiology Office Note:    Date:  08/31/2023   ID:  Mary Norris, DOB 1945-11-15, MRN [id number]  PCP:  Olga Berthold, NP  Cardiologist:  Ralene Burger, MD    Referring MD: Olga Berthold, NP   Chief Complaint  Patient presents with   Follow-up    History of Present Illness:    Mary Norris is a 78 y.o. female past medical history significant for hypertension, first-degree AV block, migraines, stage III kidney disease GERD IBS diabetes dyslipidemia anemia initially she was seen at the beginning of 2024 because of episode of dizziness and syncope.  She did have orthostatic hypotension, stress clinic after that per son and amlodipine has been withdrawn since that time she is doing fine.  Last visit in our office was in October when she was evaluated before elective knee surgery.  That evaluation included stress test which was negative.  She did have surgery with no difficulties no complication doing well.  Past Medical History:  Diagnosis Date   Allergic rhinitis 07/29/2019   Allergy    Anemia, chronic disease 07/29/2019   Anxiety    ARF (acute renal failure) (HCC) 07/17/2011   Arthritis    Benign hypertension with chronic kidney disease, stage III (HCC) 07/19/2011   Chicken pox    Chronic renal failure 07/19/2011   Chronic tension-type headache, not intractable 07/29/2019   CKD (chronic kidney disease) stage 3, GFR 30-59 ml/min (HCC) 05/14/2016   Current moderate episode of major depressive disorder (HCC) 07/29/2019   Depression 12/10/2015   Dyslipidemia 07/29/2019   Essential hypertension 07/19/2011   First degree AV block 07/19/2011   Generalized obesity 07/29/2019   GERD (gastroesophageal reflux disease)    History of colon polyps    HTN (hypertension) 07/19/2011   Hypercholesterolemia 07/29/2019   Hyperlipidemia    Hyperlipidemia associated with type 2 diabetes mellitus (HCC) 12/10/2015   Hyperpotassemia 07/29/2019   IBS (irritable bowel syndrome)     Increased frequency of urination 06/10/2017   Formatting of this note might be different from the original. 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 10/08/2011   Uncontrolled type 2 diabetes mellitus with chronic kidney disease 10/08/2011    Past Surgical History:  Procedure Laterality Date   ABDOMINAL HYSTERECTOMY  1975   ANKLE FRACTURE SURGERY Right 06/30/2021   Ankle fracture dislocation repair   APPENDECTOMY  1963   BLADDER TACK  1975   BREAST BIOPSY  1999   CATARACT EXTRACTION, BILATERAL Bilateral 06/13/2014   CHOLECYSTECTOMY  1977   EXCISIONAL HEMORRHOIDECTOMY  2003   LEFT KNEE REPAIR  10/2019   ROTATOR CUFF REPAIR Right 2006   TUBAL LIGATION      Current Medications: Current Meds  Medication Sig   amLODipine (NORVASC) 10 MG tablet Take 10 mg by mouth daily.   aspirin 81 MG EC tablet Take 1 tablet by mouth daily.   Calcium Carb-Cholecalciferol  600-800 MG-UNIT CHEW Chew 1 tablet by mouth daily.   carvedilol  (COREG ) 6.25 MG tablet Take 1 tablet (6.25 mg total) by mouth 2 (two) times daily.   Cholecalciferol 25 MCG (1000 UT) tablet Take 1,000 Units by mouth daily.   citalopram  (CELEXA ) 40 MG tablet Take 40 mg by mouth daily.   ferrous sulfate  325 (65 FE) MG tablet Take 1 tablet by mouth daily with breakfast.   hydrOXYzine (ATARAX) 25 MG tablet Take 25 mg by mouth at  bedtime.   insulin  NPH-regular Human (70-30) 100 UNIT/ML injection Inject 10-25 Units into the skin See admin instructions. 25 unit in the morning and 10 unitis at bedtime   Multiple Vitamin (MULTIVITAMIN) capsule Take 1 capsule by mouth daily.   Omega-3 Fatty Acids (FISH OIL) 1200 MG CAPS Take 1,200 mg by mouth daily.   pantoprazole  (PROTONIX ) 40 MG tablet Take 1 tablet by mouth daily.   pravastatin (PRAVACHOL) 40 MG tablet Take 40 mg by mouth daily.   trazodone (DESYREL) 300 MG tablet Take 300 mg by mouth at bedtime.   [DISCONTINUED] busPIRone (BUSPAR) 5 MG tablet Take 5 mg by mouth at bedtime.   [DISCONTINUED] hydrochlorothiazide (MICROZIDE) 12.5 MG capsule Take 12.5 mg by mouth daily.   [DISCONTINUED] lisinopril  (ZESTRIL ) 40 MG tablet Take 1 tablet by mouth daily.   [DISCONTINUED] Oxycodone HCl 10 MG TABS Take 5-10 mg by mouth 2 (two) times daily.     Allergies:   Codeine, Lipitor [atorvastatin], Lisinopril , Morphine and codeine, Morphine sulfate, and Zocor  [simvastatin ]   Social History   Socioeconomic History   Marital status: Widowed    Spouse name: Not on file   Number of children: 2   Years of education: 67   Highest education level: Not on file  Occupational History   Occupation: Retired    Comment: [employer]  Tobacco Use   Smoking status: Never   Smokeless tobacco: Never  Substance and Sexual Activity   Alcohol use: No   Drug use: No   Sexual activity: Not Currently  Other Topics Concern   Not on file  Social History Narrative   Not on file   Social Drivers of Health   Financial Resource Strain: Not on file  Food Insecurity: Not on file  Transportation Needs: Not on file  Physical Activity: Not on file  Stress: Not on file  Social Connections: Not on file     Family History: The patient's family history includes Breast cancer in her mother; CAD in her brother and father; Cancer in her father and another family member; Diabetes Mellitus I in her maternal  grandmother; Heart attack in her father; Heart disease in her brother and father; Hypertension in her brother, father, mother, and another family member; Parkinson's disease in her mother; Stroke in her father. ROS:   Please see the history of present illness.    All 14 point review of systems negative except as described per history of present illness  EKGs/Labs/Other Studies Reviewed:         Recent Labs: No results found for requested labs within last 365 days.  Recent Lipid Panel    Component Value Date/Time   CHOL 148 02/13/2014 1103   TRIG 347.0 (H) 02/13/2014 1103   HDL 28.70 (L) 02/13/2014 1103   CHOLHDL 5 02/13/2014 1103   VLDL 69.4 (H) 02/13/2014 1103   LDLDIRECT 74.9 02/13/2014 1103    Physical Exam:    VS:  BP 126/74 (BP Location: Right Arm, Patient Position: Sitting)  Pulse 81   Ht 5\' 2"  (1.575 m)   Wt 154 lb 6.4 oz (70 kg)   SpO2 95%   BMI 28.24 kg/m     Wt Readings from Last 3 Encounters:  08/31/23 154 lb 6.4 oz (70 kg)  02/25/23 178 lb (80.7 kg)  02/13/23 178 lb 6.4 oz (80.9 kg)     GEN:  Well nourished, well developed in no acute distress HEENT: Normal NECK: No JVD; No carotid bruits LYMPHATICS: No lymphadenopathy CARDIAC: RRR, no murmurs, no rubs, no gallops RESPIRATORY:  Clear to auscultation without rales, wheezing or rhonchi  ABDOMEN: Soft, non-tender, non-distended MUSCULOSKELETAL:  No edema; No deformity  SKIN: Warm and dry LOWER EXTREMITIES: no swelling NEUROLOGIC:  Alert and oriented x 3 PSYCHIATRIC:  Normal affect   ASSESSMENT:    1. Benign hypertension with chronic kidney disease, stage III (HCC)   2. Hyperlipidemia associated with type 2 diabetes mellitus (HCC)   3. Stage 3 chronic kidney disease, unspecified whether stage 3a or 3b CKD (HCC)   4. Mixed hyperlipidemia    PLAN:    In order of problems listed above:  Essential hypertension blood pressure well-controlled continue present management. Dyslipidemia I did review K  PN which show LDL 82 HDL 51 she is taking her pravastatin 40 which I will continue. Dizziness and syncope denies having any after tricyclic antidepressant has been withdrawn, Diabetes stable last hemoglobin A1c is 6.7 from April of this year reasonable controlled   Medication Adjustments/Labs and Tests Ordered: Current medicines are reviewed at length with the patient today.  Concerns regarding medicines are outlined above.  No orders of the defined types were placed in this encounter.  Medication changes: No orders of the defined types were placed in this encounter.   Signed, Manfred Seed, MD, [facility] 08/31/2023 2:27 PM     [facility]

## 2023-09-07 DIAGNOSIS — E1165 Type 2 diabetes mellitus with hyperglycemia: Secondary | ICD-10-CM | POA: Diagnosis not present

## 2023-09-07 DIAGNOSIS — Z794 Long term (current) use of insulin: Secondary | ICD-10-CM | POA: Diagnosis not present

## 2023-09-22 DIAGNOSIS — E7800 Pure hypercholesterolemia, unspecified: Secondary | ICD-10-CM | POA: Diagnosis not present

## 2023-09-22 DIAGNOSIS — F321 Major depressive disorder, single episode, moderate: Secondary | ICD-10-CM | POA: Diagnosis not present

## 2023-09-22 DIAGNOSIS — N183 Chronic kidney disease, stage 3 unspecified: Secondary | ICD-10-CM | POA: Diagnosis not present

## 2023-09-22 DIAGNOSIS — Z794 Long term (current) use of insulin: Secondary | ICD-10-CM | POA: Diagnosis not present

## 2023-09-22 DIAGNOSIS — Z0000 Encounter for general adult medical examination without abnormal findings: Secondary | ICD-10-CM | POA: Diagnosis not present

## 2023-09-22 DIAGNOSIS — E1122 Type 2 diabetes mellitus with diabetic chronic kidney disease: Secondary | ICD-10-CM | POA: Diagnosis not present

## 2023-09-22 DIAGNOSIS — I10 Essential (primary) hypertension: Secondary | ICD-10-CM | POA: Diagnosis not present

## 2023-09-22 DIAGNOSIS — Z9181 History of falling: Secondary | ICD-10-CM | POA: Diagnosis not present

## 2023-09-22 DIAGNOSIS — N1831 Chronic kidney disease, stage 3a: Secondary | ICD-10-CM | POA: Diagnosis not present

## 2023-11-03 DIAGNOSIS — E118 Type 2 diabetes mellitus with unspecified complications: Secondary | ICD-10-CM | POA: Diagnosis not present

## 2023-11-04 DIAGNOSIS — M1712 Unilateral primary osteoarthritis, left knee: Secondary | ICD-10-CM | POA: Diagnosis not present

## 2023-11-28 ENCOUNTER — Other Ambulatory Visit: Payer: Self-pay | Admitting: Cardiology

## 2023-12-15 DIAGNOSIS — N1832 Chronic kidney disease, stage 3b: Secondary | ICD-10-CM | POA: Diagnosis not present

## 2023-12-15 DIAGNOSIS — E669 Obesity, unspecified: Secondary | ICD-10-CM | POA: Diagnosis not present

## 2023-12-15 DIAGNOSIS — Z794 Long term (current) use of insulin: Secondary | ICD-10-CM | POA: Diagnosis not present

## 2023-12-15 DIAGNOSIS — I129 Hypertensive chronic kidney disease with stage 1 through stage 4 chronic kidney disease, or unspecified chronic kidney disease: Secondary | ICD-10-CM | POA: Diagnosis not present

## 2023-12-15 DIAGNOSIS — E1122 Type 2 diabetes mellitus with diabetic chronic kidney disease: Secondary | ICD-10-CM | POA: Diagnosis not present

## 2023-12-25 DIAGNOSIS — G43909 Migraine, unspecified, not intractable, without status migrainosus: Secondary | ICD-10-CM | POA: Diagnosis not present

## 2023-12-25 DIAGNOSIS — E042 Nontoxic multinodular goiter: Secondary | ICD-10-CM | POA: Diagnosis not present

## 2023-12-25 DIAGNOSIS — E118 Type 2 diabetes mellitus with unspecified complications: Secondary | ICD-10-CM | POA: Diagnosis not present

## 2023-12-25 DIAGNOSIS — N2581 Secondary hyperparathyroidism of renal origin: Secondary | ICD-10-CM | POA: Diagnosis not present

## 2023-12-25 DIAGNOSIS — I10 Essential (primary) hypertension: Secondary | ICD-10-CM | POA: Diagnosis not present

## 2023-12-25 DIAGNOSIS — R809 Proteinuria, unspecified: Secondary | ICD-10-CM | POA: Diagnosis not present

## 2023-12-25 DIAGNOSIS — E782 Mixed hyperlipidemia: Secondary | ICD-10-CM | POA: Diagnosis not present

## 2023-12-25 DIAGNOSIS — N183 Chronic kidney disease, stage 3 unspecified: Secondary | ICD-10-CM | POA: Diagnosis not present

## 2023-12-29 DIAGNOSIS — E1122 Type 2 diabetes mellitus with diabetic chronic kidney disease: Secondary | ICD-10-CM | POA: Diagnosis not present

## 2023-12-29 DIAGNOSIS — N183 Chronic kidney disease, stage 3 unspecified: Secondary | ICD-10-CM | POA: Diagnosis not present

## 2023-12-29 DIAGNOSIS — D638 Anemia in other chronic diseases classified elsewhere: Secondary | ICD-10-CM | POA: Diagnosis not present

## 2023-12-29 DIAGNOSIS — E7800 Pure hypercholesterolemia, unspecified: Secondary | ICD-10-CM | POA: Diagnosis not present

## 2023-12-29 DIAGNOSIS — N1831 Chronic kidney disease, stage 3a: Secondary | ICD-10-CM | POA: Diagnosis not present

## 2023-12-29 DIAGNOSIS — F321 Major depressive disorder, single episode, moderate: Secondary | ICD-10-CM | POA: Diagnosis not present

## 2023-12-29 DIAGNOSIS — I10 Essential (primary) hypertension: Secondary | ICD-10-CM | POA: Diagnosis not present

## 2023-12-29 DIAGNOSIS — M1990 Unspecified osteoarthritis, unspecified site: Secondary | ICD-10-CM | POA: Diagnosis not present

## 2023-12-29 DIAGNOSIS — Z794 Long term (current) use of insulin: Secondary | ICD-10-CM | POA: Diagnosis not present

## 2024-01-19 DIAGNOSIS — T162XXA Foreign body in left ear, initial encounter: Secondary | ICD-10-CM | POA: Diagnosis not present

## 2024-01-19 DIAGNOSIS — H7291 Unspecified perforation of tympanic membrane, right ear: Secondary | ICD-10-CM | POA: Diagnosis not present

## 2024-02-01 DIAGNOSIS — E118 Type 2 diabetes mellitus with unspecified complications: Secondary | ICD-10-CM | POA: Diagnosis not present

## 2024-03-29 DIAGNOSIS — E7800 Pure hypercholesterolemia, unspecified: Secondary | ICD-10-CM | POA: Diagnosis not present
